# Patient Record
Sex: Male | Born: 1955 | Race: White | Hispanic: No | Marital: Married | Smoking: Former smoker
Health system: Southern US, Community
[De-identification: ages and names within clinical notes are randomized; demographics above are authoritative.]

## PROBLEM LIST (undated history)

## (undated) DIAGNOSIS — I10 Essential (primary) hypertension: Secondary | ICD-10-CM

## (undated) DIAGNOSIS — E785 Hyperlipidemia, unspecified: Secondary | ICD-10-CM

## (undated) DIAGNOSIS — L739 Follicular disorder, unspecified: Secondary | ICD-10-CM

## (undated) DIAGNOSIS — IMO0001 Reserved for inherently not codable concepts without codable children: Secondary | ICD-10-CM

## (undated) DIAGNOSIS — IMO0002 Reserved for concepts with insufficient information to code with codable children: Secondary | ICD-10-CM

## (undated) DIAGNOSIS — R000 Tachycardia, unspecified: Secondary | ICD-10-CM

## (undated) DIAGNOSIS — R030 Elevated blood-pressure reading, without diagnosis of hypertension: Secondary | ICD-10-CM

## (undated) HISTORY — PX: SKIN CANCER EXCISION: SHX779

## (undated) HISTORY — DX: Essential (primary) hypertension: I10

## (undated) HISTORY — PX: CORONARY ANGIOPLASTY WITH STENT PLACEMENT: SHX49

## (undated) HISTORY — DX: Elevated blood-pressure reading, without diagnosis of hypertension: R03.0

## (undated) HISTORY — DX: Hyperlipidemia, unspecified: E78.5

## (undated) HISTORY — DX: Reserved for inherently not codable concepts without codable children: IMO0001

## (undated) HISTORY — DX: Tachycardia, unspecified: R00.0

## (undated) HISTORY — DX: Reserved for concepts with insufficient information to code with codable children: IMO0002

## (undated) HISTORY — PX: SKIN CANCER DESTRUCTION: SHX778

## (undated) HISTORY — DX: Follicular disorder, unspecified: L73.9

---

## 2012-04-09 ENCOUNTER — Ambulatory Visit (INDEPENDENT_AMBULATORY_CARE_PROVIDER_SITE_OTHER): Payer: BC Managed Care – PPO | Admitting: Family Medicine

## 2012-04-09 ENCOUNTER — Encounter: Payer: Self-pay | Admitting: Family Medicine

## 2012-04-09 VITALS — BP 147/100 | HR 105 | Ht 68.0 in | Wt 240.0 lb

## 2012-04-09 DIAGNOSIS — R030 Elevated blood-pressure reading, without diagnosis of hypertension: Secondary | ICD-10-CM

## 2012-04-09 DIAGNOSIS — R000 Tachycardia, unspecified: Secondary | ICD-10-CM

## 2012-04-09 NOTE — Patient Instructions (Addendum)
Hypertension As your heart beats, it forces blood through your arteries. This force is your blood pressure. If the pressure is too high, it is called hypertension (HTN) or high blood pressure. HTN is dangerous because you may have it and not know it. High blood pressure may mean that your heart has to work harder to pump blood. Your arteries may be narrow or stiff. The extra work puts you at risk for heart disease, stroke, and other problems.  Blood pressure consists of two numbers, a higher number over a lower, 110/72, for example. It is stated as "110 over 72." The ideal is below 120 for the top number (systolic) and under 80 for the bottom (diastolic). Write down your blood pressure today. You should pay close attention to your blood pressure if you have certain conditions such as:  Heart failure.   Prior heart attack.   Diabetes   Chronic kidney disease.   Prior stroke.   Multiple risk factors for heart disease.  To see if you have HTN, your blood pressure should be measured while you are seated with your arm held at the level of the heart. It should be measured at least twice. A one-time elevated blood pressure reading (especially in the Emergency Department) does not mean that you need treatment. There may be conditions in which the blood pressure is different between your right and left arms. It is important to see your caregiver soon for a recheck. Most people have essential hypertension which means that there is not a specific cause. This type of high blood pressure may be lowered by changing lifestyle factors such as:  Stress.   Smoking.   Lack of exercise.   Excessive weight.   Drug/tobacco/alcohol use.   Eating less salt.  Most people do not have symptoms from high blood pressure until it has caused damage to the body. Effective treatment can often prevent, delay or reduce that damage. TREATMENT  When a cause has been identified, treatment for high blood pressure is  directed at the cause. There are a large number of medications to treat HTN. These fall into several categories, and your caregiver will help you select the medicines that are best for you. Medications may have side effects. You should review side effects with your caregiver. If your blood pressure stays high after you have made lifestyle changes or started on medicines,   Your medication(s) may need to be changed.   Other problems may need to be addressed.   Be certain you understand your prescriptions, and know how and when to take your medicine.   Be sure to follow up with your caregiver within the time frame advised (usually within two weeks) to have your blood pressure rechecked and to review your medications.   If you are taking more than one medicine to lower your blood pressure, make sure you know how and at what times they should be taken. Taking two medicines at the same time can result in blood pressure that is too low.  SEEK IMMEDIATE MEDICAL CARE IF:  You develop a severe headache, blurred or changing vision, or confusion.   You have unusual weakness or numbness, or a faint feeling.   You have severe chest or abdominal pain, vomiting, or breathing problems.  MAKE SURE YOU:   Understand these instructions.   Will watch your condition.   Will get help right away if you are not doing well or get worse.  Document Released: 08/05/2005 Document Revised: 07/25/2011 Document Reviewed:   03/25/2008 Exit[employer] Patient Information 2012 [place], [place].How to Take Your Blood Pressure  These instructions are only for electronic home blood pressure machines. You will need:   An automatic or semi-automatic blood pressure machine.   Fresh batteries for the blood pressure machine.  HOW DO I USE THESE TOOLS TO CHECK MY BLOOD PRESSURE?   There are 2 numbers that make up your blood pressure. For example: 120/80.   The first number (120 in our example) is called the "systolic pressure." It  is a measure of the pressure in your blood vessels when your heart is pumping blood.   The second number (80 in our example) is called the "diastolic pressure." It is a measure of the pressure in your blood vessels when your heart is resting between beats.   Before you buy a home blood pressure machine, check the size of your arm so you can buy the right size cuff. Here is how to check the size of your arm:   Use a tape measure that shows both inches and centimeters.   Wrap the tape measure around the middle upper part of your arm. You may need someone to help you measure right.   Write down your arm measurement in both inches and centimeters.   To measure your blood pressure right, it is important to have the right size cuff.   If your arm is up to 13 inches (37 to 34 centimeters), get an adult cuff size.   If your arm is 13 to 17 inches (35 to 44 centimeters), get a large adult cuff size.   If your arm is 17 to 20 inches (45 to 52 centimeters), get an adult thigh cuff.   Try to rest or relax for at least 30 minutes before you check your blood pressure.   Do not smoke.   Do not have any drinks with caffeine, such as:   Pop.   Coffee.   Tea.   Check your blood pressure in a quiet room.   Sit down and stretch out your arm on a table. Keep your arm at about the level of your heart. Let your arm relax.  GETTING BLOOD PRESSURE READINGS  Make sure you remove any tight-fighting clothing from your arm. Wrap the cuff around your upper arm. Wrap it just above the bend, and above where you felt the pulse. You should be able to slip a finger between the cuff and your arm. If you cannot slip a finger in the cuff, it is too tight and should be removed and rewrapped.   Some units requires you to manually pump up the arm cuff.   Automatic units inflate the cuff when you press a button.   Cuff deflation is automatic in both models.   After the cuff is inflated, the unit measures your  blood pressure and pulse. The readings are displayed on a monitor. Hold still and breathe normally while the cuff is inflated.   Getting a reading takes less than a minute.   Some models store readings in a memory. Some provide a printout of readings.   Get readings at different times of the day. You should wait at least 5 minutes between readings. Take readings with you to your next doctor's visit.  Document Released: 07/18/2008 Document Revised: 07/25/2011 Document Reviewed: 07/18/2008 [facility] Patient Information 2012 [place], [place].blooBlood Pressure Record Sheet Your blood pressure on this visit to the Emergency Department or Clinic is elevated. This does not necessarily mean you have hypertension,  but it does mean that your blood pressure needs to be rechecked. Many times blood pressure increases because of illness, pain, anxiety, or other factors. We recommend that you get a series of blood pressure readings done over a period of about 5 days. It is best to get a reading in the morning and one in the evening. You should make sure you sit and relax for 1 to 5 minutes before the reading is taken. Write the readings down and make a follow-up appointment with your doctor to discuss the results. If there is not a free clinic or a drug store with blood pressure taking machines near you, you can purchase good blood pressure taking equipment from a drug store, often for less than the price of a physician's office call. Having one in the home also allows you the convenience of taking your blood pressure while you are home and in relaxed surroundings. Your blood pressure in the Emergency Department or Clinic on ________ was ____________________. BLOOD PRESSURE LOG Date: _______________________  a.m. _____________________   p.m. _____________________  Date: _______________________  a.m. _____________________   p.m. _____________________  Date: _______________________  a.m. _____________________    p.m. _____________________  Date: _______________________  a.m. _____________________   p.m. _____________________  Date: _______________________  a.m. _____________________   p.m. _____________________  Document Released: 05/04/2003 Document Revised: 07/25/2011 Document Reviewed: 08/05/2005 ExitCare Patient Information 2012 [place], [place].

## 2012-04-09 NOTE — Progress Notes (Signed)
  Subjective:    Patient ID: Craig Mccormick, male    DOB: 1956-08-18, 56 y.o.   MRN: [id number] #1 Hypertension This is a new problem. The current episode started today. The problem is unchanged. The problem is uncontrolled. Pertinent negatives include no anxiety, blurred vision, chest pain, headaches, malaise/fatigue, neck pain, orthopnea, palpitations, peripheral edema, PND, shortness of breath or sweats. Agents associated with hypertension include decongestants, NSAIDs, amphetamines and anorectics. Risk factors for coronary artery disease include male gender, obesity, smoking/tobacco exposure and family history. Past treatments include nothing. There are no compliance problems.  There is no history of angina, kidney disease, CAD/MI, CVA, heart failure, left ventricular hypertrophy, PVD, renovascular disease, retinopathy or a thyroid problem. There is no history of chronic renal disease, coarctation of the aorta, hyperaldosteronism, hypercortisolism, hyperparathyroidism, a hypertension causing med, pheochromocytoma or sleep apnea.   #2 tachycardia he was in the [facility] this past weekend because of tachycardia. This is a second time he had a significant tachycardia he was given medication he states his cardiac enzymes were normal and he was released. He was also given diagnosis of dehydration as well.  #3 history of some sleep disturbance treatment with melatonin.   #4 tobacco abuse. He is using smokeless cigarettes i.e. electronic cigarettes to reduce his smoking exposure to the tar another agents.  Review of Systems  Constitutional: Negative for malaise/fatigue.  HENT: Negative for neck pain.   Eyes: Negative for blurred vision.  Respiratory: Negative for shortness of breath.   Cardiovascular: Negative for chest pain, palpitations, orthopnea and PND.  Neurological: Negative for headaches.      BP 147/100  Pulse 105  Ht 5\' 8"  (1.727 m)  Wt 240 lb (108.863 kg)  BMI 36.49 kg/m2   SpO2 94% Objective:   Physical Exam  Vitals reviewed. Constitutional: He appears well-developed and well-nourished.  HENT:  Head: Normocephalic and atraumatic.  Right Ear: External ear normal.  Left Ear: External ear normal.  Neck: Normal range of motion. Neck supple.  Cardiovascular: Normal rate, regular rhythm and normal heart sounds.   Pulmonary/Chest: Effort normal and breath sounds normal.  Musculoskeletal: Normal range of motion.  Neurological: He is alert. He has normal reflexes.  Skin: Skin is warm.       Multiple scars on face from previous skin biopsies  Psychiatric: He has a normal mood and affect. His behavior is normal.      Assessment & Plan:  #1 elevated blood pressure/#2 HX of  Tachycardia. Patient has had 2 episodes that he can remember of tachycardia no known history of elevation of his blood pressure but Sunday when he was in the ED he does admit that this his blood pressure may have been artificially low because of the rapid rate and the dehydration he was experiencing I recommended that he takes his blood pressure response a week lot those results in force and return in 2 months for yearly examination at that time we'll make a decision needs to go on blood pressure medicine and  #3 sleep disturbance. He takes melatonin for that or just take it for a few days in use is good for a while before the 6 to comes back. Since it works out but continued to maintain him on the melatonin.  #4 tobacco abuse discussed the need to continue wean himself off of all cigarettes.

## 2012-06-11 ENCOUNTER — Encounter: Payer: Self-pay | Admitting: Family Medicine

## 2012-06-11 ENCOUNTER — Ambulatory Visit (INDEPENDENT_AMBULATORY_CARE_PROVIDER_SITE_OTHER): Payer: BC Managed Care – PPO | Admitting: Family Medicine

## 2012-06-11 VITALS — BP 153/102 | HR 99 | Ht 68.0 in | Wt 240.0 lb

## 2012-06-11 DIAGNOSIS — R9431 Abnormal electrocardiogram [ECG] [EKG]: Secondary | ICD-10-CM

## 2012-06-11 DIAGNOSIS — K625 Hemorrhage of anus and rectum: Secondary | ICD-10-CM

## 2012-06-11 DIAGNOSIS — Z0000 Encounter for general adult medical examination without abnormal findings: Secondary | ICD-10-CM

## 2012-06-11 DIAGNOSIS — Z716 Tobacco abuse counseling: Secondary | ICD-10-CM

## 2012-06-11 DIAGNOSIS — I10 Essential (primary) hypertension: Secondary | ICD-10-CM

## 2012-06-11 LAB — POCT URINALYSIS DIPSTICK
Blood, UA: NEGATIVE
Protein, UA: NEGATIVE
Spec Grav, UA: >=1.03
Urobilinogen, UA: 0.2

## 2012-06-11 MED ORDER — LISINOPRIL-HYDROCHLOROTHIAZIDE 10-12.5 MG PO TABS: 1.0000 | ORAL_TABLET | Freq: Every day | ORAL | Status: DC

## 2012-06-11 NOTE — Patient Instructions (Signed)
Hypertension As your heart beats, it forces blood through your arteries. This force is your blood pressure. If the pressure is too high, it is called hypertension (HTN) or high blood pressure. HTN is dangerous because you may have it and not know it. High blood pressure may mean that your heart has to work harder to pump blood. Your arteries may be narrow or stiff. The extra work puts you at risk for heart disease, stroke, and other problems.  Blood pressure consists of two numbers, a higher number over a lower, 110/72, for example. It is stated as "110 over 72." The ideal is below 120 for the top number (systolic) and under 80 for the bottom (diastolic). Write down your blood pressure today. You should pay close attention to your blood pressure if you have certain conditions such as:  Heart failure.  Prior heart attack.  Diabetes  Chronic kidney disease.  Prior stroke.  Multiple risk factors for heart disease. To see if you have HTN, your blood pressure should be measured while you are seated with your arm held at the level of the heart. It should be measured at least twice. A one-time elevated blood pressure reading (especially in the Emergency Department) does not mean that you need treatment. There may be conditions in which the blood pressure is different between your right and left arms. It is important to see your caregiver soon for a recheck. Most people have essential hypertension which means that there is not a specific cause. This type of high blood pressure may be lowered by changing lifestyle factors such as:  Stress.  Smoking.  Lack of exercise.  Excessive weight.  Drug/tobacco/alcohol use.  Eating less salt. Most people do not have symptoms from high blood pressure until it has caused damage to the body. Effective treatment can often prevent, delay or reduce that damage. TREATMENT  When a cause has been identified, treatment for high blood pressure is directed at the  cause. There are a large number of medications to treat HTN. These fall into several categories, and your caregiver will help you select the medicines that are best for you. Medications may have side effects. You should review side effects with your caregiver. If your blood pressure stays high after you have made lifestyle changes or started on medicines,   Your medication(s) may need to be changed.  Other problems may need to be addressed.  Be certain you understand your prescriptions, and know how and when to take your medicine.  Be sure to follow up with your caregiver within the time frame advised (usually within two weeks) to have your blood pressure rechecked and to review your medications.  If you are taking more than one medicine to lower your blood pressure, make sure you know how and at what times they should be taken. Taking two medicines at the same time can result in blood pressure that is too low. SEEK IMMEDIATE MEDICAL CARE IF:  You develop a severe headache, blurred or changing vision, or confusion.  You have unusual weakness or numbness, or a faint feeling.  You have severe chest or abdominal pain, vomiting, or breathing problems. MAKE SURE YOU:   Understand these instructions.  Will watch your condition.  Will get help right away if you are not doing well or get worse. Document Released: 08/05/2005 Document Revised: 10/28/2011 Document Reviewed: 03/25/2008 [facility] Patient Information 2013 [place], [place]. Colonoscopy A colonoscopy is an exam to evaluate your entire colon. In this exam, your colon  is cleansed. A long fiberoptic tube is inserted through your rectum and into your colon. The fiberoptic scope (endoscope) is a long bundle of enclosed and very flexible fibers. These fibers transmit light to the area examined and send images from that area to your caregiver. Discomfort is usually minimal. You may be given a drug to help you sleep (sedative) during or prior to  the procedure. This exam helps to detect lumps (tumors), polyps, inflammation, and areas of bleeding. Your caregiver may also take a small piece of tissue (biopsy) that will be examined under a microscope. LET YOUR CAREGIVER KNOW ABOUT:   Allergies to food or medicine.  Medicines taken, including vitamins, herbs, eyedrops, over-the-counter medicines, and creams.  Use of steroids (by mouth or creams).  Previous problems with anesthetics or numbing medicines.  History of bleeding problems or blood clots.  Previous surgery.  Other health problems, including diabetes and kidney problems.  Possibility of pregnancy, if this applies. BEFORE THE PROCEDURE   A clear liquid diet may be required for 2 days before the exam.  Ask your caregiver about changing or stopping your regular medications.  Liquid injections (enemas) or laxatives may be required.  A large amount of electrolyte solution may be given to you to drink over a short period of time. This solution is used to clean out your colon.  You should be present 60 minutes prior to your procedure or as directed by your caregiver. AFTER THE PROCEDURE   If you received a sedative or pain relieving medication, you will need to arrange for someone to drive you home.  Occasionally, there is a little blood passed with the first bowel movement. Do not be concerned. FINDING OUT THE RESULTS OF YOUR TEST Not all test results are available during your visit. If your test results are not back during the visit, make an appointment with your caregiver to find out the results. Do not assume everything is normal if you have not heard from your caregiver or the medical facility. It is important for you to follow up on all of your test results. HOME CARE INSTRUCTIONS   It is not unusual to pass moderate amounts of gas and experience mild abdominal cramping following the procedure. This is due to air being used to inflate your colon during the exam.  Walking or a warm pack on your belly (abdomen) may help.  You may resume all normal meals and activities after sedatives and medicines have worn off.  Only take over-the-counter or prescription medicines for pain, discomfort, or fever as directed by your caregiver. Do not use aspirin or blood thinners if a biopsy was taken. Consult your caregiver for medicine usage if biopsies were taken. SEEK IMMEDIATE MEDICAL CARE IF:   You have a fever.  You pass large blood clots or fill a toilet with blood following the procedure. This may also occur 10 to 14 days following the procedure. This is more likely if a biopsy was taken.  You develop abdominal pain that keeps getting worse and cannot be relieved with medicine. Document Released: 08/02/2000 Document Revised: 10/28/2011 Document Reviewed: 03/17/2008 [facility] Patient Information 2013 [place], [place].

## 2012-06-11 NOTE — Progress Notes (Signed)
Subjective:    Patient ID: Craig Mccormick, male    DOB: Oct 08, 1955, 56 y.o.   MRN: [id number]  HPI  Patient is here for health maintaince.  Review of Systems  All other systems reviewed and are negative.   No Known Allergies History   Social History  . Marital Status: Married    Spouse Name: N/A    Number of Children: N/A  . Years of Education: N/A   Occupational History  . Not on file.   Social History Main Topics  . Smoking status: Current Every Day Smoker -- 0.5 packs/day    Types: Cigarettes  . Smokeless tobacco: Never Used  . Alcohol Use: 2.9 oz/week    4 Glasses of wine, 1 Drinks containing 0.5 oz of alcohol per week  . Drug Use: No  . Sexually Active: Yes    Birth Control/ Protection: Surgical   Other Topics Concern  . Not on file   Social History Narrative  . No narrative on file   Family History  Problem Relation Age of Onset  . Cancer Mother   . Heart disease Father   . Prostate cancer Father   . Hyperlipidemia Brother    Past Medical History  Diagnosis Date  . Tachycardia   . Elevated BP   . Squamous carcinoma   . Folliculitis    Past Surgical History  Procedure Date  . Skin cancer destruction   . Skin cancer excision        BP 153/102  Pulse 99  Ht 5\' 8"  (1.727 m)  Wt 240 lb (108.863 kg)  BMI 36.49 kg/m2  SpO2 95% Objective:   Physical Exam  Vitals reviewed. Constitutional: He is oriented to person, place, and time. He appears well-developed and well-nourished.  HENT:  Head: Normocephalic.  Right Ear: External ear normal.  Left Ear: External ear normal.  Eyes: Pupils are equal, round, and reactive to light.  Neck: Normal range of motion. Neck supple. No thyromegaly present.  Cardiovascular: Normal rate, regular rhythm and normal heart sounds.   Pulmonary/Chest: Effort normal and breath sounds normal. No respiratory distress. He has no wheezes.  Abdominal: Soft. Bowel sounds are normal. There is no tenderness. There is no  rebound. Hernia confirmed negative in the right inguinal area and confirmed negative in the left inguinal area.  Genitourinary: Prostate normal, testes normal and penis normal. Rectal exam shows no external hemorrhoid, no fissure, no mass, no tenderness and anal tone normal. Guaiac positive stool. Prostate is not enlarged and not tender. Right testis shows no mass and no tenderness. Left testis shows no mass and no tenderness. Circumcised. No phimosis, paraphimosis, hypospadias, penile erythema or penile tenderness. No discharge found.  Musculoskeletal: Normal range of motion. He exhibits no edema and no tenderness.  Lymphadenopathy:       Right: No inguinal adenopathy present.       Left: No inguinal adenopathy present.  Neurological: He is alert and oriented to person, place, and time. He has normal reflexes. No cranial nerve deficit.  Skin: Skin is warm and dry.  Psychiatric: He has a normal mood and affect. His behavior is normal.   / Results for orders placed in visit on 06/11/12  POCT URINALYSIS DIPSTICK      Component Value Range   Color, UA yellow     Clarity, UA clear     Glucose, UA neg     Bilirubin, UA neg     Ketones, UA neg     Spec  Grav, UA >=1.030     Blood, UA neg     pH, UA 5.5     Protein, UA neg     Urobilinogen, UA 0.2     Nitrite, UA neg     Leukocytes, UA Negative     EKG. in comparison to EKG he had 2 months ago which showed tachycardia with ischemic changes EKG was normal today.     Assessment & Plan:   #1 health maintenance. #2 tobacco abuse warned him to stop smoking #3 elevated blood pressure now diagnosed with hypertension. Lisinopril 10/12.5 one tablet a day followup in 4 months. #4 abnormal EKG with tachycardia resolved no ischemic changes. #5 rectal bleeding. Hemoccult was positive. He's had trouble with hemorrhoids before he also is due by age for colonoscopy we'll get him a GI referral for colonoscopy.

## 2012-06-12 ENCOUNTER — Encounter: Payer: Self-pay | Admitting: *Deleted

## 2012-06-12 LAB — LIPID PANEL
Cholesterol: 265 mg/dL — ABNORMAL HIGH (ref 0–200)
HDL: 40 mg/dL (ref >39)
LDL Cholesterol: 163 mg/dL — ABNORMAL HIGH (ref 0–99)
Triglycerides: 309 mg/dL — ABNORMAL HIGH (ref <150)

## 2012-06-12 LAB — COMPLETE METABOLIC PANEL WITH GFR
Alkaline Phosphatase: 65 U/L (ref 39–117)
BUN: 14 mg/dL (ref 6–23)
Creat: 0.94 mg/dL (ref 0.50–1.35)
GFR, Est Non African American: >89 mL/min
Glucose, Bld: 88 mg/dL (ref 70–99)
Total Bilirubin: 0.9 mg/dL (ref 0.3–1.2)

## 2012-06-12 LAB — PSA, TOTAL AND FREE: PSA, Free: 0.19 ng/mL

## 2012-06-16 ENCOUNTER — Other Ambulatory Visit: Payer: Self-pay | Admitting: Family Medicine

## 2012-06-16 ENCOUNTER — Encounter: Payer: Self-pay | Admitting: Family Medicine

## 2012-06-16 DIAGNOSIS — E785 Hyperlipidemia, unspecified: Secondary | ICD-10-CM

## 2012-06-16 MED ORDER — ATORVASTATIN CALCIUM 10 MG PO TABS: 10.0000 mg | ORAL_TABLET | Freq: Every day | ORAL | Status: DC

## 2012-06-16 NOTE — Progress Notes (Signed)
Pt informed

## 2012-10-12 ENCOUNTER — Ambulatory Visit: Payer: BC Managed Care – PPO | Admitting: Family Medicine

## 2012-10-12 DIAGNOSIS — Z0289 Encounter for other administrative examinations: Secondary | ICD-10-CM

## 2012-12-23 ENCOUNTER — Encounter: Payer: Self-pay | Admitting: Family Medicine

## 2012-12-23 DIAGNOSIS — I2510 Atherosclerotic heart disease of native coronary artery without angina pectoris: Secondary | ICD-10-CM | POA: Insufficient documentation

## 2012-12-23 DIAGNOSIS — Z9861 Coronary angioplasty status: Secondary | ICD-10-CM | POA: Insufficient documentation

## 2013-01-19 ENCOUNTER — Encounter: Payer: Self-pay | Admitting: Family Medicine

## 2013-01-19 ENCOUNTER — Other Ambulatory Visit: Payer: Self-pay | Admitting: Family Medicine

## 2013-01-19 DIAGNOSIS — I2510 Atherosclerotic heart disease of native coronary artery without angina pectoris: Secondary | ICD-10-CM

## 2013-06-04 ENCOUNTER — Ambulatory Visit (INDEPENDENT_AMBULATORY_CARE_PROVIDER_SITE_OTHER): Payer: BC Managed Care – PPO | Admitting: Family Medicine

## 2013-06-04 ENCOUNTER — Encounter: Payer: Self-pay | Admitting: Family Medicine

## 2013-06-04 VITALS — BP 117/85 | HR 93 | Temp 98.0°F | Wt 242.0 lb

## 2013-06-04 DIAGNOSIS — J209 Acute bronchitis, unspecified: Secondary | ICD-10-CM

## 2013-06-04 MED ORDER — DOXYCYCLINE HYCLATE 100 MG PO TABS: ORAL_TABLET | ORAL | Status: AC

## 2013-06-04 NOTE — Progress Notes (Signed)
CC: Craig Mccormick is a 57 y.o. male is here for chest congestion and Cough   Subjective: HPI:  Patient complains of cough that has been present since Monday of this week described as nonproductive mild to moderate in severity have been worsening all week however today feels 50% better. has been accompanied by facial pressure in the forehead along with nasal congestion. Has had chills on a nightly basis without objective fever. He denies shortness of breath or wheezing or chest pain. Symptoms are present all hours of the day worse in the morning slightly improved with Alka-Seltzer   Review Of Systems Outlined In HPI  Past Medical History  Diagnosis Date  . Tachycardia   . Elevated BP   . Squamous carcinoma   . Folliculitis      Family History  Problem Relation Age of Onset  . Cancer Mother   . Heart disease Father   . Prostate cancer Father   . Hyperlipidemia Brother      History  Substance Use Topics  . Smoking status: Current Every Day Smoker -- 0.50 packs/day    Types: Cigarettes  . Smokeless tobacco: Never Used  . Alcohol Use: 2.9 oz/week    4 Glasses of wine, 1 Drinks containing 0.5 oz of alcohol per week     Objective: Filed Vitals:   06/04/13 1610  BP: 117/85  Pulse: 93  Temp: 98 F (36.7 C)    General: Alert and Oriented, No Acute Distress HEENT: Pupils equal, round, reactive to light. Conjunctivae clear.  External ears unremarkable, canals clear with intact TMs with appropriate landmarks.  Middle ear appears open without effusion. Boggy erythematous inferior turbinates with moderate mucoid discharge.  Moist mucous membranes, pharynx without inflammation nor lesions.  Neck supple without palpable lymphadenopathy nor abnormal masses. Lungs: Clear to auscultation bilaterally, no wheezing/ronchi/rales.  Comfortable work of breathing. Good air movement. Cardiac: Regular rate and rhythm. Normal S1/S2.  No murmurs, rubs, nor gallops.   Extremities: No peripheral  edema.  Strong peripheral pulses.  Mental Status: No depression, anxiety, nor agitation. Skin: Warm and dry.  Assessment & Plan: Craig Mccormick was seen today for chest congestion and cough.  Diagnoses and associated orders for this visit:  Acute bronchitis - doxycycline (VIBRA-TABS) 100 MG tablet; One by mouth twice a day for ten days.    Reassurance provided that this is likely a resolving viral respiratory illness, should he have any rebound of his cough or facial pressure start doxycycline over the weekend otherwise continue Alka-Seltzer cold and sinus would expect daily improvement with this alone.  Return if symptoms worsen or fail to improve.

## 2013-06-18 ENCOUNTER — Encounter: Payer: Self-pay | Admitting: Family Medicine

## 2013-06-18 ENCOUNTER — Ambulatory Visit (INDEPENDENT_AMBULATORY_CARE_PROVIDER_SITE_OTHER): Payer: BC Managed Care – PPO | Admitting: Family Medicine

## 2013-06-18 VITALS — BP 118/79 | HR 89 | Wt 241.0 lb

## 2013-06-18 DIAGNOSIS — E785 Hyperlipidemia, unspecified: Secondary | ICD-10-CM

## 2013-06-18 DIAGNOSIS — I10 Essential (primary) hypertension: Secondary | ICD-10-CM

## 2013-06-18 DIAGNOSIS — I2510 Atherosclerotic heart disease of native coronary artery without angina pectoris: Secondary | ICD-10-CM

## 2013-06-18 DIAGNOSIS — Z125 Encounter for screening for malignant neoplasm of prostate: Secondary | ICD-10-CM

## 2013-06-18 DIAGNOSIS — R1110 Vomiting, unspecified: Secondary | ICD-10-CM

## 2013-06-18 DIAGNOSIS — Z1211 Encounter for screening for malignant neoplasm of colon: Secondary | ICD-10-CM

## 2013-06-18 NOTE — Progress Notes (Signed)
CC: Craig Mccormick is a 57 y.o. male is here for discuss labs   Subjective: HPI:  Followup hyperlipidemia: Has been taking Lipitor and a daily basis it has been greater than one year since his cholesterol was checked by [place] he is unsure whether or not he had it checked at the time of his heart attack over the summer. Denies right upper quadrant pain myalgias nor skin or scleral discoloration. Carries a diagnosis of coronary artery disease with followup with cardiology in 6 months denies chest pain or shortness of breath since his discharge.  Complains that he was been vomiting one to 2 times a day ever since starting doxycycline it comes on suddenly is described as green bile small in volume and has not been accompanied by any other symptoms. He states he feels great up until the point where he vomits within seconds later is back to feeling great again. Denies fevers, chills, abdominal pain, right upper quadrant pain, nausea, confusion, decreased appetite, diarrhea or constipation  Followup hypertension: He is asking if he can cut back on some of his blood pressure medications no outside blood pressures to report.   Review Of Systems Outlined In HPI  Past Medical History  Diagnosis Date  . Tachycardia   . Elevated BP   . Squamous carcinoma   . Folliculitis      Family History  Problem Relation Age of Onset  . Cancer Mother   . Heart disease Father   . Prostate cancer Father   . Hyperlipidemia Brother      History  Substance Use Topics  . Smoking status: Current Every Day Smoker -- 0.50 packs/day    Types: Cigarettes  . Smokeless tobacco: Never Used  . Alcohol Use: 2.9 oz/week    4 Glasses of wine, 1 Drinks containing 0.5 oz of alcohol per week     Objective: Filed Vitals:   06/18/13 1604  BP: 118/79  Pulse: 89    General: Alert and Oriented, No Acute Distress HEENT: Pupils equal, round, reactive to light. Conjunctivae clear.  Moist mucous membranes pharynx  unremarkable Lungs: Clear to auscultation bilaterally, no wheezing/ronchi/rales.  Comfortable work of breathing. Good air movement. Cardiac: Regular rate and rhythm. Normal S1/S2.  No murmurs, rubs, nor gallops.   Abdomen: Normal bowel sounds, soft and non tender without palpable masses. No rebound or guarding Extremities: No peripheral edema.  Strong peripheral pulses.  Mental Status: No depression, anxiety, nor agitation. Skin: Warm and dry.  Assessment & Plan: Craig Mccormick was seen today for discuss labs.  Diagnoses and associated orders for this visit:  CAD (coronary artery disease)  Hyperlipidemia - Lipid Profile  Vomiting - COMPLETE METABOLIC PANEL WITH GFR  Screening for prostate cancer - PSA  Screening for colon cancer - Ambulatory referral to Gastroenterology  Hypertension    Coronary artery disease: Stable continue asprin and effient. Hyperlipidemia: Due for recheck with goal LDL less than 70, checking liver enzymes Vomiting: Suspect this is due to doxycycline which he has finished his regimen, if symptoms return 48 hours after the last dose will pursue a more aggressive workup however start with metabolic panel Hypertension: He takes his blood pressure at home therefore he can take half a tablet of lisinopril-hydrochlorothiazide if blood pressure remains below 140/90 for week consider stopping completely un less blood pressure returns above 140/90 point. Under no circumstances should he stop the Toprol unless told to by his cardiologist He is overdue for PSA and colonoscopy  Return in about 3 months (  around 09/18/2013).

## 2013-06-19 LAB — COMPLETE METABOLIC PANEL WITH GFR
Albumin: 4.2 g/dL (ref 3.5–5.2)
BUN: 16 mg/dL (ref 6–23)
CO2: 27 mEq/L (ref 19–32)
Calcium: 9.1 mg/dL (ref 8.4–10.5)
Chloride: 103 mEq/L (ref 96–112)
GFR, Est African American: >89 mL/min
GFR, Est Non African American: >89 mL/min
Glucose, Bld: 88 mg/dL (ref 70–99)
Potassium: 4 mEq/L (ref 3.5–5.3)
Sodium: 139 mEq/L (ref 135–145)
Total Protein: 6.7 g/dL (ref 6.0–8.3)

## 2013-06-19 LAB — PSA: PSA: 0.78 ng/mL (ref <=4.00)

## 2013-06-19 LAB — LIPID PANEL
Cholesterol: 168 mg/dL (ref 0–200)
VLDL: 41 mg/dL — ABNORMAL HIGH (ref 0–40)

## 2013-06-21 ENCOUNTER — Telehealth: Payer: Self-pay | Admitting: Family Medicine

## 2013-06-21 DIAGNOSIS — I2510 Atherosclerotic heart disease of native coronary artery without angina pectoris: Secondary | ICD-10-CM

## 2013-06-21 DIAGNOSIS — E785 Hyperlipidemia, unspecified: Secondary | ICD-10-CM

## 2013-06-21 MED ORDER — ATORVASTATIN CALCIUM 80 MG PO TABS: 80.0000 mg | ORAL_TABLET | Freq: Every day | ORAL | Status: DC

## 2013-06-21 NOTE — Telephone Encounter (Signed)
Sue Lush, Will you please let Mr. Riviello know that his LDL cholesterol was just a few points above his goal of less than 70.  I would encourage him to increase his lipitor to 80mg  daily, I've sent an updated Rx to his [facility] on [address].  Fasting blood sugar, kidney function, and liver function were perfect.  His PSA was normal suggesting low risk of prostate cancer.  Return in 3 months for cholesterol recheck.

## 2013-06-21 NOTE — Telephone Encounter (Signed)
Pt.notified

## 2013-07-13 ENCOUNTER — Encounter: Payer: Self-pay | Admitting: Physician Assistant

## 2013-07-28 ENCOUNTER — Other Ambulatory Visit: Payer: Self-pay | Admitting: *Deleted

## 2013-07-28 ENCOUNTER — Telehealth: Payer: Self-pay | Admitting: *Deleted

## 2013-07-28 NOTE — Telephone Encounter (Signed)
The patient called and said his cardiologist is Dr. Donnetta Simpers from [facility].  In June he had angioplasty with stent placement.  I told him I will call them for records. I told the patient if they will not send me the records without a release signed, I will have him sign it when he comes here for his appointment.

## 2013-07-28 NOTE — Telephone Encounter (Signed)
LM for patient asking if he had a cardiologist. Asked him to please call me back before his appointment on Friday.

## 2013-07-30 ENCOUNTER — Encounter: Payer: Self-pay | Admitting: Physician Assistant

## 2013-07-30 ENCOUNTER — Ambulatory Visit (INDEPENDENT_AMBULATORY_CARE_PROVIDER_SITE_OTHER): Payer: BC Managed Care – PPO | Admitting: Physician Assistant

## 2013-07-30 VITALS — BP 120/82 | HR 76 | Ht 68.0 in | Wt 244.6 lb

## 2013-07-30 DIAGNOSIS — Z1211 Encounter for screening for malignant neoplasm of colon: Secondary | ICD-10-CM

## 2013-07-30 DIAGNOSIS — Z7901 Long term (current) use of anticoagulants: Secondary | ICD-10-CM

## 2013-07-30 NOTE — Progress Notes (Signed)
Reviewed and agree with management. Robert D. Kaplan, M.D., FACG  

## 2013-07-30 NOTE — Patient Instructions (Signed)
We will send you a letter in August 2015 to call and make an appointment to see Mike Gip PA-C to discuss a colonoscopy.

## 2013-07-30 NOTE — Progress Notes (Signed)
Subjective:    Patient ID: Craig Mccormick, male    DOB: August 29, 1955, 57 y.o.   MRN: [id number]  HPI  Craig Mccormick is a pleasant 57 year old white male new to GI today, referred by his primary care physician for colon neoplasia screening. Patient has no current GI symptoms. Specifically no complaints of abdominal pain alteration in bowel habits melena or hematochezia. His family history is negative for colon cancer polyps as far as he is aware. Patient states that he developed some PSVT earlier in the year and then some exertional chest pressure which led to cardiac evaluation and cardiac catheter. He was found to have an LAD stenosis and states that he underwent stent placement with Dr. Donnetta Simpers  at [facility] cardiology. He has been on Effient  Since. He says he feels well and has had no further symptoms. He was told that he would be on the Effient  at least a year.     Review of Systems  Constitutional: Negative.   HENT: Negative.   Eyes: Negative.   Respiratory: Negative.   Cardiovascular: Negative.   Gastrointestinal: Negative.   Endocrine: Negative.   Genitourinary: Negative.   Musculoskeletal: Negative.   Skin: Negative.   Allergic/Immunologic: Negative.   Neurological: Negative.   Hematological: Negative.   Psychiatric/Behavioral: Negative.    Outpatient Prescriptions Prior to Visit  Medication Sig Dispense Refill  . aspirin 81 MG tablet Take 81 mg by mouth daily.      Marland Kitchen atorvastatin (LIPITOR) 80 MG tablet Take 1 tablet (80 mg total) by mouth daily.  30 tablet  11  . fish oil-omega-3 fatty acids 1000 MG capsule Take 2 g by mouth daily.      . metoprolol (LOPRESSOR) 50 MG tablet Take 50 mg by mouth 2 (two) times daily.   30 tablet  11  . prasugrel (EFFIENT) 10 MG TABS Take 1 tablet (10 mg total) by mouth daily.  30 tablet  11  . co-enzyme Q-10 30 MG capsule Take 30 mg by mouth 3 (three) times daily.      Marland Kitchen lisinopril-hydrochlorothiazide (ZESTORETIC) 10-12.5 MG per tablet Take 1  tablet by mouth daily.  1 tablet  0   No facility-administered medications prior to visit.   No Known Allergies Patient Active Problem List   Diagnosis Date Noted  . Hyperlipidemia 06/18/2013  . CAD (coronary artery disease) 12/23/2012  . S/P PTCA (percutaneous transluminal coronary angioplasty) 12/23/2012  . Hypertension 06/11/2012   History  Substance Use Topics  . Smoking status: Current Every Day Smoker -- 0.50 packs/day    Types: Cigarettes  . Smokeless tobacco: Never Used     Comment: pt attempting to d/c; form given 07-30-13  . Alcohol Use: 0.0 oz/week     Comment: 1 drink daily   family history includes Diabetes in his father; Heart disease in his father; Hyperlipidemia in his brother; Lung cancer in his mother; Prostate cancer in his father. There is no history of Colon cancer, Throat cancer, Kidney disease, or Liver disease.      Objective:   Physical Exam well-developed white male in no acute distress, pleasant blood pressure 120/82 pulse 76 height 5 foot 8 weight 244. HEENT;nontraumatic normocephalic EOMI PERRLA sclera anicteric, Supple no JVD, Cardiovascular ;regular rate and rhythm with S1-S2 no murmur or gallop, Pulmonary; clear bilaterally, Abdomen; soft nontender nondistended bowel sounds are active there is no palpable mass or hepatosplenomegaly, Rectal ;exam not done, Extremities; no clubbing cyanosis or edema skin warm and dry, Psych; mood and  affect appropriate        Assessment& Plan:  #11  57 year old white male referred for colon neoplasia screening, average risk- asymptomatic #2 coronary artery disease status post PTCA and stent LAD June 2014 #3 chronic anticoagulation-on Effient   Plan; Discussion with the patient regarding screening colonoscopy  Patient would need to stop his anticoagulation and therefore do not feel he is an appropriate candidate for colonoscopy just yet. Would like him to complete one year of anticoagulation and discuss with his  cardiologist. We will make him a followup reminder to come back and schedule colonoscopy late summer 2015 once he is more safely able to come off of anticoagulation.

## 2013-08-27 ENCOUNTER — Encounter: Payer: Self-pay | Admitting: Family Medicine

## 2013-08-27 DIAGNOSIS — Z0000 Encounter for general adult medical examination without abnormal findings: Secondary | ICD-10-CM | POA: Insufficient documentation

## 2013-10-26 ENCOUNTER — Ambulatory Visit (INDEPENDENT_AMBULATORY_CARE_PROVIDER_SITE_OTHER): Payer: BC Managed Care – PPO | Admitting: Family Medicine

## 2013-10-26 ENCOUNTER — Encounter: Payer: Self-pay | Admitting: Family Medicine

## 2013-10-26 VITALS — BP 124/86 | HR 77 | Temp 97.5°F | Wt 241.0 lb

## 2013-10-26 DIAGNOSIS — R000 Tachycardia, unspecified: Secondary | ICD-10-CM

## 2013-10-26 DIAGNOSIS — B9689 Other specified bacterial agents as the cause of diseases classified elsewhere: Secondary | ICD-10-CM

## 2013-10-26 DIAGNOSIS — J329 Chronic sinusitis, unspecified: Secondary | ICD-10-CM

## 2013-10-26 DIAGNOSIS — A499 Bacterial infection, unspecified: Secondary | ICD-10-CM

## 2013-10-26 MED ORDER — AMOXICILLIN-POT CLAVULANATE 500-125 MG PO TABS: ORAL_TABLET | ORAL | Status: AC

## 2013-10-26 NOTE — Progress Notes (Signed)
CC: Craig Mccormick is a 58 y.o. male is here for head and chest congestion   Subjective: HPI:  Complains of nasal congestion and facial pressure localizes beneath both eyes that has been present for the past 1-1/2 weeks. Was improving until this last Saturday for his significant decline occurred in the above symptoms now moderate severity present all hours of the day. Slightly improved with TheraFlu nothing else makes better or worse. Accompanied by subjective fevers for the past 2 days. Denies cough, shortness of breath, wheezing, chest pain, motor or sensory disturbances nor rashes.  He had an episode of tachycardia that occurred over the weekend he had been leaning over for a little under a minute and when suddenly standing up he estimates his pulse got as high as the 160. After sitting down for a minute his pulse slowly subsided he denies any chest pain during this episode. He did take a nitroglycerin tablet and had almost sudden return of his pulse to somewhere around the 90s or below. He denies any episodes like this recently or remotely other than this one episode. Since the episode occurred he can climb a flight of stairs without any shortness of breath irregular heartbeat or chest pain.   Review Of Systems Outlined In HPI  Past Medical History  Diagnosis Date  . Tachycardia   . Elevated BP   . Squamous carcinoma   . Folliculitis   . Hypertension   . Hyperlipemia     Past Surgical History  Procedure Laterality Date  . Skin cancer destruction    . Skin cancer excision    . Coronary angioplasty with stent placement     Family History  Problem Relation Age of Onset  . Lung cancer Mother   . Heart disease Father   . Prostate cancer Father   . Hyperlipidemia Brother   . Colon cancer Neg Hx   . Throat cancer Neg Hx   . Diabetes Father   . Kidney disease Neg Hx   . Liver disease Neg Hx     History   Social History  . Marital Status: Married    Spouse Name: N/A    Number  of Children: N/A  . Years of Education: N/A   Occupational History  . Not on file.   Social History Main Topics  . Smoking status: Current Every Day Smoker -- 0.50 packs/day    Types: Cigarettes  . Smokeless tobacco: Never Used     Comment: pt attempting to d/c; form given 07-30-13  . Alcohol Use: 0.0 oz/week     Comment: 1 drink daily  . Drug Use: No  . Sexual Activity: Yes    Birth Control/ Protection: Surgical   Other Topics Concern  . Not on file   Social History Narrative  . No narrative on file     Objective: BP 124/86  Pulse 77  Temp(Src) 97.5 F (36.4 C) (Oral)  Wt 241 lb (109.317 kg)  SpO2 96%  General: Alert and Oriented, No Acute Distress HEENT: Pupils equal, round, reactive to light. Conjunctivae clear.  External ears unremarkable, canals clear with intact TMs with appropriate landmarks.  Middle ear appears open without effusion. Pink inferior turbinates.  Moist mucous membranes, pharynx without inflammation nor lesions however moderate cobblestoning and postnasal drip.  Neck supple without palpable lymphadenopathy nor abnormal masses. Lungs: Clear to auscultation bilaterally, no wheezing/ronchi/rales.  Comfortable work of breathing. Good air movement. Cardiac: Regular rate and rhythm. Normal S1/S2.  No murmurs, rubs, nor gallops.  No carotid bruits Mental Status: No depression, anxiety, nor agitation. Skin: Warm and dry.  Assessment & Plan: Craig Mccormick was seen today for head and chest congestion.  Diagnoses and associated orders for this visit:  Bacterial sinusitis - amoxicillin-clavulanate (AUGMENTIN) 500-125 MG per tablet; Take one by mouth every 8 hours for ten total days.  Tachycardia    Bacterial sinusitis: Start Augmentin consider nasal saline washes avoid decongestants Tachycardia: Suspect that his brief episode of tachycardia was due to a change in position causing transient hypotension with resultant tachycardia.  This is likely further  complicated by using decongestants for the above sinusitis. If this event occurs despite avoiding decongestants followup with cardiology urgently  Return if symptoms worsen or fail to improve.

## 2013-11-15 ENCOUNTER — Encounter: Payer: Self-pay | Admitting: Emergency Medicine

## 2013-11-15 ENCOUNTER — Emergency Department (INDEPENDENT_AMBULATORY_CARE_PROVIDER_SITE_OTHER): Payer: BC Managed Care – PPO

## 2013-11-15 ENCOUNTER — Emergency Department
Admission: EM | Admit: 2013-11-15 | Discharge: 2013-11-15 | Disposition: A | Discharge status: Home / Self Care | Payer: BC Managed Care – PPO | Source: Home / Self Care | Attending: Family Medicine | Admitting: Family Medicine

## 2013-11-15 DIAGNOSIS — S8263XA Displaced fracture of lateral malleolus of unspecified fibula, initial encounter for closed fracture: Secondary | ICD-10-CM

## 2013-11-15 DIAGNOSIS — X58XXXA Exposure to other specified factors, initial encounter: Secondary | ICD-10-CM

## 2013-11-15 DIAGNOSIS — S93401A Sprain of unspecified ligament of right ankle, initial encounter: Secondary | ICD-10-CM

## 2013-11-15 DIAGNOSIS — S93409A Sprain of unspecified ligament of unspecified ankle, initial encounter: Secondary | ICD-10-CM

## 2013-11-15 DIAGNOSIS — S8261XA Displaced fracture of lateral malleolus of right fibula, initial encounter for closed fracture: Secondary | ICD-10-CM

## 2013-11-15 NOTE — ED Notes (Signed)
Craig Mccormick reports rolling his right ankle yesterday hearing a "pop". Swelling present. Previous fracture.

## 2013-11-15 NOTE — Discharge Instructions (Signed)
Apply ice pack for 30 minutes every 2 to 4 hours until swelling decreases.  Elevate. Wear cam walker.  Use crutches until cleared by sports medicine physician.  May take Tylenol for pain.  Ensure adequate Vitamin D and calcium intake.   Ankle Sprain An ankle sprain is an injury to the strong, fibrous tissues (ligaments) that hold the bones of your ankle joint together.  CAUSES An ankle sprain is usually caused by a fall or by twisting your ankle. Ankle sprains most commonly occur when you step on the outer edge of your foot, and your ankle turns inward. People who participate in sports are more prone to these types of injuries.  SYMPTOMS   Pain in your ankle. The pain may be present at rest or only when you are trying to stand or walk.  Swelling.  Bruising. Bruising may develop immediately or within 1 to 2 days after your injury.  Difficulty standing or walking, particularly when turning corners or changing directions. DIAGNOSIS  Your caregiver will ask you details about your injury and perform a physical exam of your ankle to determine if you have an ankle sprain. During the physical exam, your caregiver will press on and apply pressure to specific areas of your foot and ankle. Your caregiver will try to move your ankle in certain ways. An X-ray exam may be done to be sure a bone was not broken or a ligament did not separate from one of the bones in your ankle (avulsion fracture).  TREATMENT  Certain types of braces can help stabilize your ankle. Your caregiver can make a recommendation for this. Your caregiver may recommend the use of medicine for pain. If your sprain is severe, your caregiver may refer you to a surgeon who helps to restore function to parts of your skeletal system (orthopedist) or a physical therapist. HOME CARE INSTRUCTIONS   Apply ice to your injury for 1 2 days or as directed by your caregiver. Applying ice helps to reduce inflammation and pain.  Put ice in a plastic  bag.  Place a towel between your skin and the bag.  Leave the ice on for 15-20 minutes at a time, every 2 hours while you are awake.  Only take over-the-counter or prescription medicines for pain, discomfort, or fever as directed by your caregiver.  Elevate your injured ankle above the level of your heart as much as possible for 2 3 days.  If your caregiver recommends crutches, use them as instructed. Gradually put weight on the affected ankle. Continue to use crutches or a cane until you can walk without feeling pain in your ankle.  If you have a plaster splint, wear the splint as directed by your caregiver. Do not rest it on anything harder than a pillow for the first 24 hours. Do not put weight on it. Do not get it wet. You may take it off to take a shower or bath.  You may have been given an elastic bandage to wear around your ankle to provide support. If the elastic bandage is too tight (you have numbness or tingling in your foot or your foot becomes cold and blue), adjust the bandage to make it comfortable.  If you have an air splint, you may blow more air into it or let air out to make it more comfortable. You may take your splint off at night and before taking a shower or bath. Wiggle your toes in the splint several times per day to decrease swelling.  SEEK MEDICAL CARE IF:   You have rapidly increasing bruising or swelling.  Your toes feel extremely cold or you lose feeling in your foot.  Your pain is not relieved with medicine. SEEK IMMEDIATE MEDICAL CARE IF:  Your toes are numb or blue.  You have severe pain that is increasing. MAKE SURE YOU:   Understand these instructions.  Will watch your condition.  Will get help right away if you are not doing well or get worse. Document Released: 08/05/2005 Document Revised: 04/29/2012 Document Reviewed: 08/17/2011 [facility] Patient Information 2014 [employer], [place].

## 2013-11-15 NOTE — ED Provider Notes (Signed)
CSN: [id number]     Arrival date & time 11/15/13  1637 History   First MD Initiated Contact with Patient 11/15/13 1734     Chief Complaint  Patient presents with  . Ankle Pain      HPI Comments: Patient inverted his right ankle yesterday and heard/felt a "popping" sensation in his ankle.  He has had persistent pain and swelling.  Patient is a 58 y.o. male presenting with ankle pain. The history is provided by the patient.  Ankle Pain Location:  Ankle Time since incident:  1 day Injury: yes   Mechanism of injury comment:  "rolled" ankle Ankle location:  R ankle Pain details:    Quality:  Aching   Radiates to:  Does not radiate   Severity:  Mild   Onset quality:  Sudden   Duration:  1 day   Timing:  Constant   Progression:  Unchanged Chronicity:  New Dislocation: no   Prior injury to area:  Yes Relieved by:  Nothing Worsened by:  Bearing weight Ineffective treatments:  None tried Associated symptoms: decreased ROM, stiffness and swelling   Associated symptoms: no back pain, no muscle weakness, no numbness and no tingling   Risk factors: obesity     Past Medical History  Diagnosis Date  . Tachycardia   . Elevated BP   . Squamous carcinoma   . Folliculitis   . Hypertension   . Hyperlipemia    Past Surgical History  Procedure Laterality Date  . Skin cancer destruction    . Skin cancer excision    . Coronary angioplasty with stent placement     Family History  Problem Relation Age of Onset  . Lung cancer Mother   . Heart disease Father   . Prostate cancer Father   . Hyperlipidemia Brother   . Colon cancer Neg Hx   . Throat cancer Neg Hx   . Diabetes Father   . Kidney disease Neg Hx   . Liver disease Neg Hx    History  Substance Use Topics  . Smoking status: Current Every Day Smoker -- 0.50 packs/day    Types: Cigarettes  . Smokeless tobacco: Never Used     Comment: pt attempting to d/c; form given 07-30-13  . Alcohol Use: 0.0 oz/week     Comment: 1 drink  daily    Review of Systems  Musculoskeletal: Positive for stiffness. Negative for back pain.  All other systems reviewed and are negative.    Allergies  Doxycycline  Home Medications   Current Outpatient Rx  Name  Route  Sig  Dispense  Refill  . aspirin 81 MG tablet   Oral   Take 81 mg by mouth daily.         Marland Kitchen. atorvastatin (LIPITOR) 80 MG tablet   Oral   Take 1 tablet (80 mg total) by mouth daily.   30 tablet   11   . co-enzyme Q-10 30 MG capsule   Oral   Take 30 mg by mouth 3 (three) times daily.         . fish oil-omega-3 fatty acids 1000 MG capsule   Oral   Take 2 g by mouth daily.         . Melatonin 3 MG TABS   Oral   Take 5 tablets by mouth at bedtime as needed.          . metoprolol (LOPRESSOR) 50 MG tablet   Oral   Take 50 mg by mouth  2 (two) times daily.    30 tablet   11   . Multiple Vitamin (MULTIVITAMIN) tablet   Oral   Take 1 tablet by mouth daily.         . prasugrel (EFFIENT) 10 MG TABS   Oral   Take 1 tablet (10 mg total) by mouth daily.   30 tablet   11    BP 134/93  Pulse 83  Resp 14  Wt 243 lb (110.224 kg)  SpO2 97% Physical Exam  Nursing note and vitals reviewed. Constitutional: He is oriented to person, place, and time. He appears well-developed and well-nourished. No distress.  HENT:  Head: Atraumatic.  Eyes: Conjunctivae are normal. Pupils are equal, round, and reactive to light.  Musculoskeletal:       Right ankle: He exhibits swelling. He exhibits no ecchymosis, no deformity, no laceration and normal pulse. Tenderness. Lateral malleolus, AITFL, CF ligament, posterior TFL and proximal fibula tenderness found. No medial malleolus and no head of 5th metatarsal tenderness found.       Feet:  Neurological: He is alert and oriented to person, place, and time.  Skin: Skin is warm and dry.    ED Course  Procedures  none    Imaging Review Dg Ankle Complete Right  11/15/2013   CLINICAL DATA:  Pain post trauma   EXAM: RIGHT ANKLE - COMPLETE 3+ VIEW  COMPARISON:  None.  FINDINGS: Frontal, oblique, and lateral views were obtained. There is marked swelling laterally. There is a focal avulsion type fracture arising from the lateral malleolus in near anatomic alignment. No other fractures. Ankle mortise appears intact. There is a small joint effusion. There is a small spur arising from the inferior calcaneus.  IMPRESSION: Avulsion fracture arising from lateral malleolus with small joint effusion as well as marked swelling laterally. Ankle mortise appears intact.   Electronically Signed   By: Bretta Bang M.D.   On: 11/15/2013 17:34     MDM   1. Avulsion fracture of lateral malleolus of right fibula   2. Sprain of right ankle    Cam Walker applied. Apply ice pack for 30 minutes every 2 to 4 hours until swelling decreases.  Elevate. Wear cam walker.  Use crutches until cleared by sports medicine physician.  May take Tylenol for pain.  Ensure adequate Vitamin D and calcium intake. Followup with Dr. Rodney Langton ([facility]) in about 4 days.    Lattie Haw, MD 11/19/13 [phone]

## 2013-12-07 ENCOUNTER — Ambulatory Visit (INDEPENDENT_AMBULATORY_CARE_PROVIDER_SITE_OTHER): Payer: BC Managed Care – PPO | Admitting: Sports Medicine

## 2013-12-07 ENCOUNTER — Encounter: Payer: Self-pay | Admitting: Sports Medicine

## 2013-12-07 VITALS — BP 139/87 | HR 70 | Ht 68.0 in | Wt 246.0 lb

## 2013-12-07 DIAGNOSIS — S8263XA Displaced fracture of lateral malleolus of unspecified fibula, initial encounter for closed fracture: Secondary | ICD-10-CM

## 2013-12-07 DIAGNOSIS — S8261XA Displaced fracture of lateral malleolus of right fibula, initial encounter for closed fracture: Secondary | ICD-10-CM | POA: Insufficient documentation

## 2013-12-07 NOTE — Progress Notes (Signed)
Patient ID: Craig Mccormick, male   DOB: 1955/09/18, 58 y.o.   MRN: [id number]   Subjective:    I'm seeing this patient as a consultation for:  Dr. Cathren HarshBeese  CC: Fracture R. Ankle  HPI:  Craig Mccormick is a pleasant 58 yo man seen as a consult from [facility] for avulsion fracture of the right lateral malleolus.  He was "chasing" after a grandchild outside and rolled his ankle when stepping down from a curb.  He waited two days before being seen at urgent care two weeks ago for lateral ankle pain and swelling that persisted despite rest and ice.  He does not take any medications for pain and is able to tolerate what pain he is having.  He has been using an immobilizing boot for the past two weeks and has noticed a drastic improvement in pain and swelling since then.     Past medical history, Surgical history, Family history not pertinant except as noted below, Social history, Allergies, and medications have been entered into the medical record, reviewed, and no changes needed.   Review of Systems: No headache, visual changes, nausea, vomiting, diarrhea, constipation, dizziness, abdominal pain, skin rash, fevers, chills, night sweats, weight loss, swollen lymph nodes, body aches, joint swelling, muscle aches, chest pain, shortness of breath, mood changes, visual or auditory hallucinations.   Objective:   General: Well Developed, well nourished, and in no acute distress.  Neuro/Psych: Alert and oriented x3, extra-ocular muscles intact, able to move all 4 extremities, sensation grossly intact. Skin: Warm and dry, no rashes noted.  Respiratory: Not using accessory muscles, speaking in full sentences, trachea midline.  Cardiovascular: Pulses palpable, no extremity edema. Abdomen: Does not appear distended. Right Ankle: Mild swelling over the lateral malleolus and lateral foot. Range of motion is full in all directions. Strength is 5/5 in all directions. Stable lateral and medial ligaments; squeeze test and kleiger  test unremarkable; Talar dome nontender; No pain at base of 5th MT; No tenderness over cuboid; No tenderness over N spot or navicular prominence No tenderness on posterior aspects of lateral and medial malleolus No sign of peroneal tendon subluxations or tenderness to palpation Negative tarsal tunnel tinel's  X-rays show a small avulsion fracture from the tip of the lateral malleolus.  Impression and Recommendations:   This case required medical decision making of moderate complexity.  Small avulsion fracture of lateral malleolus of right ankle.  He is doing well with improvement in pain and swelling with immobilization.  -Continue boot immobilization for 2 weeks -transition to ASO brace in two weeks -tylenol prn for pain -encourage calcium and vitamin D supplement -f/u in 4 weeks

## 2013-12-07 NOTE — Assessment & Plan Note (Signed)
Fractures mild, can be treated as a severe sprain. Continue cast boot for additional 2 weeks then transitioned into a lace up-ASO brace. Avoid NSAIDs, home rehabilitation given. Return to see me in 4 weeks, I will likely turn him loose after that. Calcium and vitamin D supplement twice a day.  I billed a fracture code for this visit, all subsequent visits for this complaint will be "post-op checks" in the global period.

## 2014-01-04 ENCOUNTER — Ambulatory Visit (INDEPENDENT_AMBULATORY_CARE_PROVIDER_SITE_OTHER): Payer: BC Managed Care – PPO | Admitting: Sports Medicine

## 2014-01-04 ENCOUNTER — Encounter: Payer: Self-pay | Admitting: Sports Medicine

## 2014-01-04 VITALS — BP 134/87 | HR 85 | Ht 68.0 in | Wt 243.0 lb

## 2014-01-04 DIAGNOSIS — S8263XA Displaced fracture of lateral malleolus of unspecified fibula, initial encounter for closed fracture: Secondary | ICD-10-CM

## 2014-01-04 DIAGNOSIS — S8261XA Displaced fracture of lateral malleolus of right fibula, initial encounter for closed fracture: Secondary | ICD-10-CM

## 2014-01-04 NOTE — Assessment & Plan Note (Signed)
Clinically resolved, return as needed. 

## 2014-01-04 NOTE — Progress Notes (Signed)
  Subjective: 4 weeks post fracture of the tip of the lateral malleolus, pain-free.   Objective: General: Well-developed, well-nourished, and in no acute distress. Right Ankle: Minimally swollen, but no tenderness over the fracture site. Range of motion is full in all directions. Strength is 5/5 in all directions. Stable lateral and medial ligaments; squeeze test and kleiger test unremarkable; Talar dome nontender; No pain at base of 5th MT; No tenderness over cuboid; No tenderness over N spot or navicular prominence No tenderness on posterior aspects of lateral and medial malleolus No sign of peroneal tendon subluxations or tenderness to palpation Negative tarsal tunnel tinel's Able to walk 4 steps.  Assessment/plan:

## 2014-06-22 ENCOUNTER — Encounter: Payer: Self-pay | Admitting: *Deleted

## 2014-06-22 ENCOUNTER — Emergency Department
Admission: EM | Admit: 2014-06-22 | Discharge: 2014-06-22 | Disposition: A | Discharge status: Home / Self Care | Payer: BC Managed Care – PPO | Source: Home / Self Care | Attending: Emergency Medicine | Admitting: Emergency Medicine

## 2014-06-22 ENCOUNTER — Emergency Department (INDEPENDENT_AMBULATORY_CARE_PROVIDER_SITE_OTHER): Payer: BC Managed Care – PPO

## 2014-06-22 DIAGNOSIS — S83411A Sprain of medial collateral ligament of right knee, initial encounter: Secondary | ICD-10-CM

## 2014-06-22 DIAGNOSIS — W19XXXA Unspecified fall, initial encounter: Secondary | ICD-10-CM

## 2014-06-22 DIAGNOSIS — M25562 Pain in left knee: Secondary | ICD-10-CM

## 2014-06-22 NOTE — ED Notes (Signed)
Pt c/o LT knee pain x 3wks after twisting at home. He has worn a OTC knee sleeve x 1 wk with some relief.

## 2014-06-22 NOTE — ED Provider Notes (Signed)
CSN: [id number]     Arrival date & time 06/22/14  1725 History   First MD Initiated Contact with Patient 06/22/14 1726     Chief Complaint  Patient presents with  . Knee Pain   (Consider location/radiation/quality/duration/timing/severity/associated sxs/prior Treatment) HPI Pt c/o LT knee pain x 3wks after twisting at home. He has worn a OTC knee sleeve x 1 wk with some relief. The need pain is mild-moderate, occasionally moderate-severe with movement. He describes his sharp and throbbing at times. He denies that it has locked were given out. Past Medical History  Diagnosis Date  . Tachycardia   . Elevated BP   . Squamous carcinoma   . Folliculitis   . Hypertension   . Hyperlipemia    Past Surgical History  Procedure Laterality Date  . Skin cancer destruction    . Skin cancer excision    . Coronary angioplasty with stent placement     Family History  Problem Relation Age of Onset  . Lung cancer Mother   . Heart disease Father   . Prostate cancer Father   . Hyperlipidemia Brother   . Colon cancer Neg Hx   . Throat cancer Neg Hx   . Diabetes Father   . Kidney disease Neg Hx   . Liver disease Neg Hx    History  Substance Use Topics  . Smoking status: Current Every Day Smoker -- 0.50 packs/day    Types: Cigarettes  . Smokeless tobacco: Never Used     Comment: pt attempting to d/c; form given 07-30-13  . Alcohol Use: 0.0 oz/week     Comment: 1 drink daily    Review of Systems  All other systems reviewed and are negative.   Allergies  Doxycycline  Home Medications   Prior to Admission medications   Medication Sig Start Date End Date Taking? Authorizing Provider  aspirin 81 MG tablet Take 81 mg by mouth daily.    Historical Provider, MD  atorvastatin (LIPITOR) 80 MG tablet Take 1 tablet (80 mg total) by mouth daily. 06/21/13   Sean Hommel, DO  co-enzyme Q-10 30 MG capsule Take 30 mg by mouth 3 (three) times daily.    Historical Provider, MD  fish oil-omega-3  fatty acids 1000 MG capsule Take 2 g by mouth daily.    Historical Provider, MD  Melatonin 3 MG TABS Take 5 tablets by mouth at bedtime as needed.     Historical Provider, MD  metoprolol (LOPRESSOR) 50 MG tablet Take 50 mg by mouth 2 (two) times daily.  01/19/13   Laren BoomSean Hommel, DO  Multiple Vitamin (MULTIVITAMIN) tablet Take 1 tablet by mouth daily.    Historical Provider, MD  prasugrel (EFFIENT) 10 MG TABS Take 1 tablet (10 mg total) by mouth daily. 01/19/13   Sean Hommel, DO   BP 151/92 mmHg  Pulse 78  Temp(Src) 98.3 F (36.8 C) (Oral)  Resp 18  Ht 5\' 8"  (1.727 m)  Wt 243 lb (110.224 kg)  BMI 36.96 kg/m2  SpO2 97% Physical Exam  Constitutional: He is oriented to person, place, and time. He appears well-developed and well-nourished. No distress.  HENT:  Head: Normocephalic and atraumatic.  Eyes: Conjunctivae and EOM are normal. Pupils are equal, round, and reactive to light. No scleral icterus.  Neck: Normal range of motion.  Cardiovascular: Normal rate.   Pulmonary/Chest: Effort normal.  Abdominal: He exhibits no distension.  Musculoskeletal: Normal range of motion.  Right knee with decreased range of motion. Mild diffuse tenderness and  swelling, especially tender over medial joint line. but no definite effusion. No instability. Negative Lachman sign. Negative McMurray sign  Neurological: He is alert and oriented to person, place, and time.  Skin: Skin is warm.  Psychiatric: He has a normal mood and affect.  Nursing note and vitals reviewed.  Right lower extremity: neurovascular distally intact. No calf tenderness or cords or heat.  ED Course  Procedures (including critical care time) Labs Review Labs Reviewed - No data to display  Imaging Review Dg Knee Complete 4 Views Left  06/22/2014   CLINICAL DATA:  Pain and swelling after twisting injury 3 weeks prior  EXAM: LEFT KNEE - COMPLETE 4+ VIEW  COMPARISON:  None.  FINDINGS: Frontal, lateral, and bilateral oblique views were  obtained. No fracture or dislocation. Joint spaces appear intact. No erosive change.  IMPRESSION: No abnormality noted.   Electronically Signed   By: Bretta BangWilliam  Woodruff M.D.   On: 06/22/2014 18:05     MDM   1. Sprain of medial collateral ligament of knee, right, initial encounter   2. Fall    Treatment options discussed, as well as risks, benefits, alternatives. Patient voiced understanding and agreement with the following plans:  He prefers to use Ace and elastic knee sleeve that he has at home. Ibuprofen. He declined any prescription pain meds. Follow-up with Dr. Karie Schwalbe if not better in 1-2 weeks, sooner if worse   Lajean Manesavid Massey, MD 06/24/14 1452

## 2014-06-27 ENCOUNTER — Ambulatory Visit: Payer: BC Managed Care – PPO | Admitting: Sports Medicine

## 2014-07-22 ENCOUNTER — Telehealth: Payer: Self-pay | Admitting: Family Medicine

## 2014-07-22 DIAGNOSIS — I2510 Atherosclerotic heart disease of native coronary artery without angina pectoris: Secondary | ICD-10-CM

## 2014-07-22 MED ORDER — ATORVASTATIN CALCIUM 80 MG PO TABS: 80.0000 mg | ORAL_TABLET | Freq: Every day | ORAL | Status: DC

## 2014-07-22 NOTE — Telephone Encounter (Signed)
Needs f/u.

## 2014-08-01 ENCOUNTER — Ambulatory Visit (INDEPENDENT_AMBULATORY_CARE_PROVIDER_SITE_OTHER): Payer: BC Managed Care – PPO | Admitting: Family Medicine

## 2014-08-01 ENCOUNTER — Encounter: Payer: Self-pay | Admitting: Family Medicine

## 2014-08-01 VITALS — BP 133/85 | HR 75 | Wt 246.0 lb

## 2014-08-01 DIAGNOSIS — M25562 Pain in left knee: Secondary | ICD-10-CM | POA: Diagnosis not present

## 2014-08-01 DIAGNOSIS — I2510 Atherosclerotic heart disease of native coronary artery without angina pectoris: Secondary | ICD-10-CM

## 2014-08-01 DIAGNOSIS — I10 Essential (primary) hypertension: Secondary | ICD-10-CM | POA: Diagnosis not present

## 2014-08-01 DIAGNOSIS — E785 Hyperlipidemia, unspecified: Secondary | ICD-10-CM

## 2014-08-01 DIAGNOSIS — S7292XA Unspecified fracture of left femur, initial encounter for closed fracture: Secondary | ICD-10-CM | POA: Diagnosis not present

## 2014-08-01 MED ORDER — TRAMADOL HCL 50 MG PO TABS
50.0000 mg | ORAL_TABLET | Freq: Three times a day (TID) | ORAL | Status: DC | PRN
Start: 2014-08-01 — End: 2016-02-07

## 2014-08-01 MED ORDER — ATORVASTATIN CALCIUM 80 MG PO TABS: 80.0000 mg | ORAL_TABLET | Freq: Every day | ORAL | Status: DC

## 2014-08-01 NOTE — Progress Notes (Signed)
CC: Craig Mccormick is a 58 y.o. male is here for Hypertension   Subjective: HPI:  Hyperlipidemia: Continues on atorvastatin 80 mg daily. Without myalgias or right upper quadrant pain. He had liver function tests back from July which was normal. LDL cholesterol in July was less than 100.  No formal exercise routine. Physical activity is limited to his left knee pain. Denies any exertional chest pain, nor rest chest pain.  Follow-up essential hypertension: No outside blood pressures to report. Continues on metoprolol twice a day. Denies chest pain shortness of breath orthopnea nor peripheral edema.  Complains of left knee pain that has been present on a daily basis for the last 5 weeks. He was initially diagnosed with an MCL strain however rest has not helped any recovering he believes it is actually getting worse. Symptoms are moderate to severe in severity. Localized on the anterior medial aspect of the knee radiating to the knee. Symptoms are absent at rest or when sitting however significantly worse when walking, extending leg to complete extension, or when getting from a seated to standing position. He tells me it feels unstable and has "gone out" on him multiple times. Denies catching or locking. Interventions have been ibuprofen with only mild improvement of his pain denies swelling or overlying skin changes at the site of discomfort. Denies any pain elsewhere   Review Of Systems Outlined In HPI  Past Medical History  Diagnosis Date  . Tachycardia   . Elevated BP   . Squamous carcinoma   . Folliculitis   . Hypertension   . Hyperlipemia     Past Surgical History  Procedure Laterality Date  . Skin cancer destruction    . Skin cancer excision    . Coronary angioplasty with stent placement     Family History  Problem Relation Age of Onset  . Lung cancer Mother   . Heart disease Father   . Prostate cancer Father   . Hyperlipidemia Brother   . Colon cancer Neg Hx   . Throat cancer  Neg Hx   . Diabetes Father   . Kidney disease Neg Hx   . Liver disease Neg Hx     History   Social History  . Marital Status: Married    Spouse Name: N/A    Number of Children: N/A  . Years of Education: N/A   Occupational History  . Not on file.   Social History Main Topics  . Smoking status: Current Every Day Smoker -- 0.50 packs/day    Types: Cigarettes  . Smokeless tobacco: Never Used     Comment: pt attempting to d/c; form given 07-30-13  . Alcohol Use: 0.0 oz/week     Comment: 1 drink daily  . Drug Use: No  . Sexual Activity: Yes    Birth Control/ Protection: Surgical   Other Topics Concern  . Not on file   Social History Narrative     Objective: BP 133/85 mmHg  Pulse 75  Wt 246 lb (111.585 kg)  General: Alert and Oriented, No Acute Distress HEENT: Pupils equal, round, reactive to light. Conjunctivae clear.   Lungs: Clear to company worker breathing Cardiac: Regular rate and rhythm. Left knee exam shows full-strength and range of motion. There is no swelling, redness, nor warmth overlying the knee.  No patellar crepitus. No patellar apprehension. No pain with palpation of the inferior patellar pole.  No  laxity with valgus nor varus stress. No pain with varus stress however valgus stress does reproduce  his presenting pain mildly.Marland Kitchen. Anterior drawer is negative. McMurray's positive. No popliteal space tenderness or palpable mass. No medial or lateral joint line tenderness to palpation. Extremities: No peripheral edema.  Strong peripheral pulses.  Mental Status: No depression, anxiety, nor agitation. Skin: Warm and dry.  Assessment & Plan: Craig Mccormick was seen today for hypertension.  Diagnoses and associated orders for this visit:  Hyperlipidemia  Essential hypertension  Coronary artery disease involving native coronary artery of native heart without angina pectoris - atorvastatin (LIPITOR) 80 MG tablet; Take 1 tablet (80 mg total) by mouth daily.  Left knee  pain - MR Knee Left  Wo Contrast; Future - traMADol (ULTRAM) 50 MG tablet; Take 1 tablet (50 mg total) by mouth every 8 (eight) hours as needed.    Hyperlipidemia: Controlled however ideally his LDL should be below 70, discussed exercise interventions once we get his knee pain improved. Essential hypertension: Controlled continue metoprolol Left knee pain: I got a concern for meniscal tear and therefore an MRI has been placed to see if he needs surgical intervention. Continue ibuprofen (tramadol as needed to mask pain)   Return if symptoms worsen or fail to improve.

## 2014-08-02 ENCOUNTER — Telehealth: Payer: Self-pay | Admitting: *Deleted

## 2014-08-02 NOTE — Telephone Encounter (Signed)
MRI approval for knee [id number] valid 08/02/14-08-31-14. Craig SkainsJamie Nayshawn Mccormick, CMA

## 2014-08-08 ENCOUNTER — Ambulatory Visit (INDEPENDENT_AMBULATORY_CARE_PROVIDER_SITE_OTHER): Payer: BC Managed Care – PPO

## 2014-08-08 DIAGNOSIS — M25562 Pain in left knee: Secondary | ICD-10-CM

## 2014-08-08 DIAGNOSIS — M6588 Other synovitis and tenosynovitis, other site: Secondary | ICD-10-CM

## 2014-08-08 DIAGNOSIS — M25462 Effusion, left knee: Secondary | ICD-10-CM

## 2014-08-09 ENCOUNTER — Telehealth: Payer: Self-pay | Admitting: Family Medicine

## 2014-08-09 DIAGNOSIS — S7291XA Unspecified fracture of right femur, initial encounter for closed fracture: Secondary | ICD-10-CM

## 2014-08-09 DIAGNOSIS — S7292XA Unspecified fracture of left femur, initial encounter for closed fracture: Secondary | ICD-10-CM | POA: Insufficient documentation

## 2014-08-09 NOTE — Telephone Encounter (Signed)
[name] called back and scheduled for nurse visit.

## 2014-08-09 NOTE — Telephone Encounter (Signed)
Andrea/Jamie, Will you please let patient know that his MRI actually showed a fracture where he's been localizing his pain.  Fortunately this type of fracture responds well to immobilizing the knee and using crutches for three weeks.  Will you please provide him with these supplies via a nurse visit?  Also if he would like a refill on tramadol or something stronger for pain I'd be happy to provide it.  He'll need to focus on being 100% non weight bearing on the left knee using crutches and to wear the knee / leg immobilizer anytime he's not bathing. F/u with me one week after these interventions.

## 2014-08-09 NOTE — Progress Notes (Signed)
MRI shows fracture of L medial femoral condyle.  Advised non weight bearing with crutches and knee immobilizer for three weeks, fracture code has been added to this encounter.

## 2014-08-09 NOTE — Telephone Encounter (Signed)
Voicemail left for patient to return call regarding MRI results.

## 2014-08-10 ENCOUNTER — Ambulatory Visit (INDEPENDENT_AMBULATORY_CARE_PROVIDER_SITE_OTHER): Payer: BC Managed Care – PPO | Admitting: Family Medicine

## 2014-08-10 VITALS — BP 140/85 | HR 80 | Wt 246.0 lb

## 2014-08-10 DIAGNOSIS — S7292XD Unspecified fracture of left femur, subsequent encounter for closed fracture with routine healing: Secondary | ICD-10-CM

## 2014-08-10 NOTE — Progress Notes (Signed)
   Subjective:    Patient ID: Craig Mccormick, male    DOB: [birth year], 58 y.o.   MRN: [id number]  HPI  Craig NeedleMichael was contacted yesterday regarding his MRI results and came in to office today for knee immobilizer to treat his knee fracture. He presented to office with his own crutches. Immobilizer was placed without complication and directions for use were reviewed.    Review of Systems     Objective:   Physical Exam        Assessment & Plan:

## 2015-02-08 ENCOUNTER — Other Ambulatory Visit: Payer: Self-pay | Admitting: Family Medicine

## 2015-02-08 DIAGNOSIS — I2510 Atherosclerotic heart disease of native coronary artery without angina pectoris: Secondary | ICD-10-CM

## 2015-02-09 ENCOUNTER — Encounter: Payer: Self-pay | Admitting: Family Medicine

## 2015-02-09 DIAGNOSIS — IMO0001 Reserved for inherently not codable concepts without codable children: Secondary | ICD-10-CM | POA: Insufficient documentation

## 2015-02-17 ENCOUNTER — Encounter: Payer: Self-pay | Admitting: Family Medicine

## 2016-02-07 ENCOUNTER — Encounter: Payer: Self-pay | Admitting: Family Medicine

## 2016-02-07 ENCOUNTER — Ambulatory Visit (INDEPENDENT_AMBULATORY_CARE_PROVIDER_SITE_OTHER): Payer: BLUE CROSS/BLUE SHIELD | Admitting: Family Medicine

## 2016-02-07 VITALS — BP 130/90 | HR 66 | Wt 238.0 lb

## 2016-02-07 DIAGNOSIS — R059 Cough, unspecified: Secondary | ICD-10-CM

## 2016-02-07 DIAGNOSIS — R05 Cough: Secondary | ICD-10-CM

## 2016-02-07 MED ORDER — BUDESONIDE-FORMOTEROL FUMARATE 80-4.5 MCG/ACT IN AERO: 2.0000 | INHALATION_SPRAY | Freq: Two times a day (BID) | RESPIRATORY_TRACT | Status: DC

## 2016-02-07 NOTE — Progress Notes (Signed)
CC: Craig Mccormick is a 60 y.o. male is here for Cough   Subjective: HPI:   Earlier this month he developed a productive cough with wheezing. He was seen at a local urgent care and given Augmentin and prednisone. He additionally has been using albuterol. Antibiotics and above helped systemic symptoms such as fatigue, shortness of breath, wheezing and overall feeling of sickness but he continues to have a cough which is now dry. Symptoms are slightly improved with albuterol but nothing else makes better or worse. He currently denies any fevers, chills, wheezing, chest discomfort or shortness of breath. Cough is moderate in severity and worse at night   Review Of Systems Outlined In HPI  Past Medical History  Diagnosis Date  . Tachycardia   . Elevated BP   . Squamous carcinoma (HCC)   . Folliculitis   . Hypertension   . Hyperlipemia     Past Surgical History  Procedure Laterality Date  . Skin cancer destruction    . Skin cancer excision    . Coronary angioplasty with stent placement     Family History  Problem Relation Age of Onset  . Lung cancer Mother   . Heart disease Father   . Prostate cancer Father   . Hyperlipidemia Brother   . Colon cancer Neg Hx   . Throat cancer Neg Hx   . Diabetes Father   . Kidney disease Neg Hx   . Liver disease Neg Hx     Social History   Social History  . Marital Status: Married    Spouse Name: N/A  . Number of Children: N/A  . Years of Education: N/A   Occupational History  . Not on file.   Social History Main Topics  . Smoking status: Current Every Day Smoker -- 0.50 packs/day    Types: Cigarettes  . Smokeless tobacco: Never Used     Comment: pt attempting to d/c; form given 07-30-13  . Alcohol Use: 0.0 oz/week     Comment: 1 drink daily  . Drug Use: No  . Sexual Activity: Yes    Birth Control/ Protection: Surgical   Other Topics Concern  . Not on file   Social History Narrative     Objective: BP 130/90 mmHg  Pulse 66   Wt 238 lb (107.956 kg)  General: Alert and Oriented, No Acute Distress HEENT: Pupils equal, round, reactive to light. Conjunctivae clear.  External ears unremarkable, canals clear with intact TMs with appropriate landmarks.  Middle ear appears open without effusion. Pink inferior turbinates.  Moist mucous membranes, pharynx without inflammation nor lesions.  Neck supple without palpable lymphadenopathy nor abnormal masses. Lungs: Clear to auscultation bilaterally, no wheezing/ronchi/rales.  Comfortable work of breathing. Good air movement. Cardiac: Regular rate and rhythm. Normal S1/S2.  No murmurs, rubs, nor gallops.   Extremities: No peripheral edema.  Strong peripheral pulses.  Mental Status: No depression, anxiety, nor agitation. Skin: Warm and dry.  Assessment & Plan: Craig NeedleMichael was seen today for cough.  Diagnoses and all orders for this visit:  Cough  Other orders -     budesonide-formoterol (SYMBICORT) 80-4.5 MCG/ACT inhaler; Inhale 2 puffs into the lungs 2 (two) times daily.   Reassurance provided that I don't see any signs of infection and it is not uncommon to have a cough after an episode of bronchitis for 2 weeks after the initial infection. Will try a sample of Symbicort to see if this helps reduce his cough frequency and severity.  Return if symptoms worsen or fail to improve.

## 2016-03-22 ENCOUNTER — Ambulatory Visit (INDEPENDENT_AMBULATORY_CARE_PROVIDER_SITE_OTHER): Payer: BLUE CROSS/BLUE SHIELD | Admitting: Osteopathic Medicine

## 2016-03-22 ENCOUNTER — Encounter: Payer: Self-pay | Admitting: Osteopathic Medicine

## 2016-03-22 VITALS — BP 121/81 | HR 84 | Ht 68.0 in | Wt 235.0 lb

## 2016-03-22 DIAGNOSIS — J019 Acute sinusitis, unspecified: Secondary | ICD-10-CM

## 2016-03-22 DIAGNOSIS — R05 Cough: Secondary | ICD-10-CM | POA: Diagnosis not present

## 2016-03-22 DIAGNOSIS — R059 Cough, unspecified: Secondary | ICD-10-CM

## 2016-03-22 MED ORDER — FLUTICASONE FUROATE-VILANTEROL 100-25 MCG/INH IN AEPB: 1.0000 | INHALATION_SPRAY | Freq: Every day | RESPIRATORY_TRACT | 6 refills | Status: DC

## 2016-03-22 MED ORDER — GUAIFENESIN-CODEINE 100-10 MG/5ML PO SYRP: 5.0000 mL | ORAL_SOLUTION | Freq: Every evening | ORAL | 0 refills | Status: DC | PRN

## 2016-03-22 MED ORDER — AMOXICILLIN-POT CLAVULANATE 875-125 MG PO TABS: 1.0000 | ORAL_TABLET | Freq: Two times a day (BID) | ORAL | 0 refills | Status: DC

## 2016-03-22 MED ORDER — IPRATROPIUM BROMIDE 0.03 % NA SOLN: 2.0000 | Freq: Three times a day (TID) | NASAL | 0 refills | Status: DC | PRN

## 2016-03-22 NOTE — Patient Instructions (Signed)
Cough - Sinus infection / postnasal drip - treat with antibiotics and nasal spray to reduce cough reflex and sinus pressure.  - Persistent cough / possible COPD - will switch inhalers and consider repeat lung function tests and chest X-rays depending on how you're dong.

## 2016-03-22 NOTE — Progress Notes (Signed)
HPI: Craig Mccormick is a 60 y.o. Not Hispanic or Latino male  who presents to [facility] today, 03/22/16,  for chief complaint of:  Chief Complaint  Patient presents with  . Establish Care    cough     . Context: recently seen 02/07/16 by Dr Ivan Anchors, Tx as post-bronchitis cough, initiates Symbicort 2 puffs bid. No CXR on file here but see below for CXR from [facility] 01/28/16. Symbicort was kind of helping but had stopped taking / forgot the afternoon dose a lot.  . Location: chest and sinus issues . Quality: productive coughing.  . Duration: over past year, has happened 3 times     XR Chest Pa And Lateral 01/28/2016 [facility] Result Impression  IMPRESSION:  1.No acute cardiopulmonary disease process is identified.   Result Narrative  ADDITIONAL COMPARISON:12/21/12 INDICATION: R05: Cough Z72.0: Tobacco use TECHNIQUE: Standard 2 view chest.  FINDINGS:  #No pulmonary consolidations. #No detectable pleural effusions. #No pneumothorax. #Intact cardiomediastinal silhouette. #No acute osseous abnormality.     Past medical, surgical, social and family history reviewed: Past Medical History:  Diagnosis Date  . Elevated BP   . Folliculitis   . Hyperlipemia   . Hypertension   . Squamous carcinoma (HCC)   . Tachycardia    Past Surgical History:  Procedure Laterality Date  . CORONARY ANGIOPLASTY WITH STENT PLACEMENT    . SKIN CANCER DESTRUCTION    . SKIN CANCER EXCISION     Social History  Substance Use Topics  . Smoking status: Current Every Day Smoker    Packs/day: 0.50    Types: Cigarettes  . Smokeless tobacco: Never Used     Comment: pt attempting to d/c; form given 07-30-13  . Alcohol use 0.0 oz/week     Comment: 1 drink daily   Family History  Problem Relation Age of Onset  . Lung cancer Mother   . Heart disease Father   . Prostate cancer Father   . Hyperlipidemia Brother   . Colon cancer Neg Hx   .  Throat cancer Neg Hx   . Diabetes Father   . Kidney disease Neg Hx   . Liver disease Neg Hx      Current medication list and allergy/intolerance information reviewed:   Current Outpatient Prescriptions  Medication Sig Dispense Refill  . aspirin 81 MG tablet Take 81 mg by mouth daily.    . budesonide-formoterol (SYMBICORT) 80-4.5 MCG/ACT inhaler Inhale 2 puffs into the lungs 2 (two) times daily. 1 Inhaler 0  . clopidogrel (PLAVIX) 75 MG tablet Take 75 mg by mouth daily.    Marland Kitchen co-enzyme Q-10 30 MG capsule Take 30 mg by mouth 3 (three) times daily.    . fish oil-omega-3 fatty acids 1000 MG capsule Take 2 g by mouth daily.    . Melatonin 3 MG TABS Take 5 tablets by mouth at bedtime as needed.     . metoprolol (LOPRESSOR) 50 MG tablet Take 50 mg by mouth 2 (two) times daily.  30 tablet 11  . Multiple Vitamin (MULTIVITAMIN) tablet Take 1 tablet by mouth daily.    . rosuvastatin (CRESTOR) 40 MG tablet Take 1 tablet (40 mg total) by mouth daily. 90 tablet 3   No current facility-administered medications for this visit.    Allergies  Allergen Reactions  . Doxycycline     Vomiting      Review of Systems:  Constitutional:  No  fever, no chills, No unintentional weight changes. No significant  fatigue.   HEENT: No  headache, no vision change, no hearing change, No sore throat, (+) sinus pressure  Cardiac: No  chest pain, No  pressure, No palpitations,  Respiratory:  No  shortness of breath. (+) Cough  Gastrointestinal: No  abdominal pain, No  nausea, No  vomiting,   Musculoskeletal: No new myalgia/arthralgia  Skin: No  Rash  Exam:  BP 121/81   Pulse 84   Ht  (1.727 m)   Wt 235 lb (106.6 kg)   BMI 35.73 kg/m   Constitutional: VS see above. General Appearance: alert, well-developed, well-nourished, NAD  Eyes: Normal lids and conjunctive, non-icteric sclera  Ears, Nose, Mouth, Throat: MMM, Normal external inspection ears/nares/mouth/lips/gums. TM normal bilaterally.  Pharynx/tonsils no erythema, no exudate. Nasal mucosa normal.   Neck: No masses, trachea midline. No thyroid enlargement. No tenderness/mass appreciated. No lymphadenopathy  Respiratory: Normal respiratory effort. no wheeze, no rhonchi, no rales  Cardiovascular: S1/S2 normal, no murmur, no rub/gallop auscultated. RRR    ASSESSMENT/PLAN:   Patient having difficulty remembering to take twice a day inhaler, will switch to [facility]. Patient would like to avoid steroids if possible, this isn't sound like a significant COPD exacerbation but certainly consider it.  Consider repeat chest x-ray/pulmonary function test if no improvement, patient advised to call ahead so we can get him on the nurse visit for PFT if he is not feeling better within the next few weeks.  Acute sinusitis, recurrence not specified, unspecified location - Plan: amoxicillin-clavulanate (AUGMENTIN) 875-125 MG tablet, ipratropium (ATROVENT) 0.03 % nasal spray  Cough - Plan: fluticasone furoate-vilanterol (BREO ELLIPTA) 100-25 MCG/INH AEPB, guaiFENesin-codeine (ROBITUSSIN AC) 100-10 MG/5ML syrup     Visit summary with medication list and pertinent instructions was printed for patient to review. All questions at time of visit were answered - patient instructed to contact office with any additional concerns. ER/RTC precautions were reviewed with the patient. Follow-up plan: Return in about 2 weeks (around 04/05/2016), or if symptoms worsen or fail to improve, for COUGHING IF NO BETTER IN 2 - 4 WEEKS.

## 2016-07-27 ENCOUNTER — Emergency Department
Admission: EM | Admit: 2016-07-27 | Discharge: 2016-07-27 | Disposition: A | Discharge status: Home / Self Care | Payer: BLUE CROSS/BLUE SHIELD | Source: Home / Self Care | Attending: Emergency Medicine | Admitting: Emergency Medicine

## 2016-07-27 ENCOUNTER — Encounter: Payer: Self-pay | Admitting: Emergency Medicine

## 2016-07-27 DIAGNOSIS — J0100 Acute maxillary sinusitis, unspecified: Secondary | ICD-10-CM

## 2016-07-27 DIAGNOSIS — J209 Acute bronchitis, unspecified: Secondary | ICD-10-CM

## 2016-07-27 MED ORDER — FLUTICASONE FUROATE-VILANTEROL 200-25 MCG/INH IN AEPB: 1.0000 | INHALATION_SPRAY | Freq: Every day | RESPIRATORY_TRACT | 0 refills | Status: DC

## 2016-07-27 MED ORDER — BENZONATATE 200 MG PO CAPS: ORAL_CAPSULE | ORAL | 0 refills | Status: DC

## 2016-07-27 MED ORDER — AMOXICILLIN-POT CLAVULANATE 875-125 MG PO TABS: 1.0000 | ORAL_TABLET | Freq: Two times a day (BID) | ORAL | 0 refills | Status: DC

## 2016-07-27 NOTE — ED Provider Notes (Signed)
[facility]    CSN: [id number] Arrival date & time: 07/27/16  1429     History   Chief Complaint Chief Complaint  Patient presents with  . Nasal Congestion  . Cough    HPI Craig Mccormick is a 60 y.o. male.   HPI Patient reports congestion of sinuses and upper chest for past week. Low-grade fever with discolored nasal discharge and cough occasionally productive of yellow sputum. No hemoptysis. Has not tried any new medications for this. Two previous episodes in past year; antiibiotics and inhaler were necessary and effective. He states that chest x-ray this past year was within normal limits. He continues to smoke, he states he's cut back to one half pack a day. He denies history of asthma or COPD, although some prior PCP notes suggest possible diagnosis of COPD. Past Medical History:  Diagnosis Date  . Elevated BP   . Folliculitis   . Hyperlipemia   . Hypertension   . Squamous carcinoma   . Tachycardia     Patient Active Problem List   Diagnosis Date Noted  . Aortic sclerosis-mild 02/09/2015  . Closed fracture of left femur (HCC) 08/09/2014  . Closed fracture of lateral malleolus of right ankle 12/07/2013  . Preventative health care 08/27/2013  . Hyperlipidemia 06/18/2013  . CAD (coronary artery disease) 12/23/2012  . S/P PTCA (percutaneous transluminal coronary angioplasty) 12/23/2012  . Hypertension 06/11/2012    Past Surgical History:  Procedure Laterality Date  . CORONARY ANGIOPLASTY WITH STENT PLACEMENT    . SKIN CANCER DESTRUCTION    . SKIN CANCER EXCISION         Home Medications    Prior to Admission medications   Medication Sig Start Date End Date Taking? Authorizing Provider  amoxicillin-clavulanate (AUGMENTIN) 875-125 MG tablet Take 1 tablet by mouth 2 (two) times daily. For 10 days. Take with food. 07/27/16   Lajean Manes, MD  aspirin 81 MG tablet Take 81 mg by mouth daily.    Historical Provider, MD  benzonatate (TESSALON) 200 MG  capsule Take 1 every 8 hours as needed for cough. 07/27/16   Lajean Manes, MD  clopidogrel (PLAVIX) 75 MG tablet Take 75 mg by mouth daily.    Historical Provider, MD  co-enzyme Q-10 30 MG capsule Take 30 mg by mouth 3 (three) times daily.    Historical Provider, MD  fish oil-omega-3 fatty acids 1000 MG capsule Take 2 g by mouth daily.    Historical Provider, MD  fluticasone furoate-vilanterol (BREO ELLIPTA) 200-25 MCG/INH AEPB Inhale 1 puff into the lungs daily. 07/27/16   Lajean Manes, MD  Melatonin 3 MG TABS Take 5 tablets by mouth at bedtime as needed.     Historical Provider, MD  metoprolol (LOPRESSOR) 50 MG tablet Take 50 mg by mouth 2 (two) times daily.  01/19/13   Laren Boom, DO  Multiple Vitamin (MULTIVITAMIN) tablet Take 1 tablet by mouth daily.    Historical Provider, MD  rosuvastatin (CRESTOR) 40 MG tablet Take 1 tablet (40 mg total) by mouth daily. 02/08/15   Laren Boom, DO    Family History Family History  Problem Relation Age of Onset  . Lung cancer Mother   . Heart disease Father   . Prostate cancer Father   . Diabetes Father   . Hyperlipidemia Brother   . Colon cancer Neg Hx   . Throat cancer Neg Hx   . Kidney disease Neg Hx   . Liver disease Neg Hx     Social  History Social History  Substance Use Topics  . Smoking status: Current Every Day Smoker    Packs/day: 0.50    Types: Cigarettes  . Smokeless tobacco: Never Used     Comment: pt attempting to d/c; form given 07-30-13  . Alcohol use 0.0 oz/week     Comment: 1 drink daily     Allergies   Doxycycline   Review of Systems Review of Systems  All other systems reviewed and are negative.    Physical Exam Triage Vital Signs ED Triage Vitals  Enc Vitals Group     BP 07/27/16 1518 137/91     Pulse Rate 07/27/16 1518 93     Resp 07/27/16 1518 18     Temp 07/27/16 1518 98.3 F (36.8 C)     Temp Source 07/27/16 1518 Oral     SpO2 07/27/16 1518 95 %     Weight 07/27/16 1518 232 lb (105.2 kg)     Height  07/27/16 1518 5\' 8"  (1.727 m)     Head Circumference --      Peak Flow --      Pain Score 07/27/16 1520 0     Pain Loc --      Pain Edu? --      Excl. in GC? --    No data found.   Updated Vital Signs BP 137/91 (BP Location: Left Arm)   Pulse 93   Temp 98.3 F (36.8 C) (Oral)   Resp 18   Ht 5\' 8"  (1.727 m)   Wt 232 lb (105.2 kg)   SpO2 95%   BMI 35.28 kg/m   Visual Acuity Right Eye Distance:   Left Eye Distance:   Bilateral Distance:    Right Eye Near:   Left Eye Near:    Bilateral Near:     Physical Exam  Constitutional: He is oriented to person, place, and time. He appears well-developed and well-nourished. No distress.  HENT:  Head: Normocephalic and atraumatic.  Right Ear: Tympanic membrane, external ear and ear canal normal.  Left Ear: Tympanic membrane, external ear and ear canal normal.  Nose: Mucosal edema and rhinorrhea present. Right sinus exhibits maxillary sinus tenderness. Left sinus exhibits maxillary sinus tenderness.  Mouth/Throat: Oropharynx is clear and moist. No oral lesions. No oropharyngeal exudate.  Eyes: Right eye exhibits no discharge. Left eye exhibits no discharge. No scleral icterus.  Neck: Neck supple.  Cardiovascular: Normal rate, regular rhythm and normal heart sounds.   Pulmonary/Chest: Effort normal. No respiratory distress. He has wheezes (Rare, scattered, late expiratory. Fairly good air movement bilaterally. Breath sounds equal bilaterally.). He has rhonchi. He has no rales.  Lymphadenopathy:    He has no cervical adenopathy.  Neurological: He is alert and oriented to person, place, and time.  Skin: Skin is warm and dry.  Nursing note and vitals reviewed.    UC Treatments / Results  Labs (all labs ordered are listed, but only abnormal results are displayed) Labs Reviewed - No data to display  EKG  EKG Interpretation None       Radiology No results found. He declined chest x-ray today. Procedures Procedures  (including critical care time)  Medications Ordered in UC Medications - No data to display   Initial Impression / Assessment and Plan / UC Course  I have reviewed the triage vital signs and the nursing notes.  Pertinent labs & imaging results that were available during my care of the patient were reviewed by me and considered in  my medical decision making (see chart for details).  Clinical Course      Final Clinical Impressions(s) / UC Diagnoses   Final diagnoses:  Acute bronchitis, unspecified organism  Acute maxillary sinusitis, recurrence not specified  Minimal wheezing. Pulse ox 95% room air.  New Prescriptions Discharge Medication List as of 07/27/2016  4:11 PM     amoxicillin-clavulanate (AUGMENTIN) 875-125 MG tablet Take 1 tablet by mouth 2 (two) times daily. For 10 days. Take with food. 20 tablet  Augmentin prescribed, as he states that was very effective antibiotic used in the past. He states that Virgel BouquetBreo has helped in the past, so I refilled that, one inhalation daily. benzonatate (TESSALON) 200 MG capsule Take 1 every 8 hours as needed for cough. 20 capsule  Follow-up with your primary care doctor in 5-7 days if not improving, or sooner if symptoms become worse. Precautions discussed. Urged him to quit smoking and explain risks of not doing so. Red flags discussed. Questions invited and answered. Patient voiced understanding and agreement.     Lajean Manesavid Massey, MD 07/27/16 2005

## 2016-07-27 NOTE — ED Triage Notes (Signed)
Patient reports congestion of sinuses and upper chest for past week. Two previous episodes in past year; antiibiotics and inhaler were necessary and effective.

## 2017-04-15 ENCOUNTER — Ambulatory Visit (INDEPENDENT_AMBULATORY_CARE_PROVIDER_SITE_OTHER): Payer: BLUE CROSS/BLUE SHIELD | Admitting: Osteopathic Medicine

## 2017-04-15 ENCOUNTER — Encounter: Payer: Self-pay | Admitting: Osteopathic Medicine

## 2017-04-15 VITALS — BP 138/93 | HR 70 | Ht 68.0 in | Wt 243.0 lb

## 2017-04-15 DIAGNOSIS — I10 Essential (primary) hypertension: Secondary | ICD-10-CM

## 2017-04-15 DIAGNOSIS — M25561 Pain in right knee: Secondary | ICD-10-CM | POA: Diagnosis not present

## 2017-04-15 DIAGNOSIS — M1710 Unilateral primary osteoarthritis, unspecified knee: Secondary | ICD-10-CM | POA: Insufficient documentation

## 2017-04-15 MED ORDER — DICLOFENAC SODIUM 1 % TD GEL: 4.0000 g | Freq: Four times a day (QID) | TRANSDERMAL | 11 refills | Status: DC

## 2017-04-15 NOTE — Progress Notes (Signed)
HPI: Craig Mccormick is a 61 y.o. male  who presents to [facility] today, 04/15/17,  for chief complaint of:  Chief Complaint  Patient presents with  . Knee Pain    RIGHT     . Context: lifting bags of fertilizer few weeks ago - climbing onto truck, jumping off truck - both knees were hurting but L knee is better while R knee getting worse. Reports Hx Knee OA b/l . Location: R knee medial . Quality: sharp pain, no instability  . Duration: few weeks      Past medical history, surgical history, social history and family history reviewed.  Patient Active Problem List   Diagnosis Date Noted  . Aortic sclerosis-mild 02/09/2015  . Closed fracture of left femur (HCC) 08/09/2014  . Closed fracture of lateral malleolus of right ankle 12/07/2013  . Preventative health care 08/27/2013  . Hyperlipidemia 06/18/2013  . CAD (coronary artery disease) 12/23/2012  . S/P PTCA (percutaneous transluminal coronary angioplasty) 12/23/2012  . Hypertension 06/11/2012    Current medication list and allergy/intolerance information reviewed.   Current Outpatient Prescriptions on File Prior to Visit  Medication Sig Dispense Refill  . aspirin 81 MG tablet Take 81 mg by mouth daily.    . clopidogrel (PLAVIX) 75 MG tablet Take 75 mg by mouth daily.    . fish oil-omega-3 fatty acids 1000 MG capsule Take 2 g by mouth daily.    . Melatonin 3 MG TABS Take 5 tablets by mouth at bedtime as needed.     . metoprolol (LOPRESSOR) 50 MG tablet Take 50 mg by mouth 2 (two) times daily.  30 tablet 11  . Multiple Vitamin (MULTIVITAMIN) tablet Take 1 tablet by mouth daily.    . rosuvastatin (CRESTOR) 40 MG tablet Take 1 tablet (40 mg total) by mouth daily. 90 tablet 3   No current facility-administered medications on file prior to visit.    Allergies  Allergen Reactions  . Doxycycline     Vomiting      Review of Systems:  Constitutional: No recent illness  HEENT: No   headache  Cardiac: No  chest pain  Respiratory:  No  shortness of breath.   Gastrointestinal: No  abdominal pain  Musculoskeletal: +myalgia/arthralgia  Skin: No  Rash  Neurologic: No  weakness  Exam:  BP (!) 138/93   Pulse 70   Ht 5\' 8"  (1.727 m)   Wt 243 lb (110.2 kg)   BMI 36.95 kg/m   Constitutional: VS see above. General Appearance: alert, well-developed, well-nourished, NAD  Eyes: Normal lids and conjunctive, non-icteric sclera  Ears, Nose, Mouth, Throat: MMM, Normal external inspection ears/nares/mouth/lips/gums.  Neck: No masses, trachea midline.   Respiratory: Normal respiratory effort.  Musculoskeletal: Gait normal. Symmetric and independent movement of all extremities. Questionable scant effusion R knee medially, (+)pain to palpation medial tibial plateau/meniscus, (+)mcmurrays, neg ant/post drawer, neg varus/valgus laxity   Neurological: Normal balance/coordination. No tremor.  Skin: warm, dry, intact.   Psychiatric: Normal judgment/insight. Normal mood and affect. Oriented x3.     ASSESSMENT/PLAN:   Right knee pain, unspecified chronicity - Suspect meniscal injury / arthritis exacerbation. Appreciate consult from Dr. Denyse Amass - pt to follow up with him or Dr. Karie Schwalbe if no better  - Plan: diclofenac sodium (VOLTAREN) 1 % GEL  Essential hypertension - better on recheck, previous values ok, will follow, likely up d/t pain  Arthritis of knee    Patient Instructions  If knee pain is not better  or if it gets worse, I would recommend follow-up with one of our sports medicine specialists (Dr Denyse Amass who you saw today, or Dr. Cherylann Parr Dr. Karie Schwalbe) for further evaluation in 2-4 weeks, sooner if you get worse or notice bad swelling. Just call our office and ask for an appointment for sports medicine!      Follow-up plan: Return for recheck knee with sports medicine as directed .  Visit summary with medication list and pertinent instructions was printed for patient  to review, alert [place] if any changes needed. All questions at time of visit were answered - patient instructed to contact office with any additional concerns. ER/RTC precautions were reviewed with the patient and understanding verbalized.

## 2017-04-15 NOTE — Patient Instructions (Addendum)
If knee pain is not better or if it gets worse, I would recommend follow-up with one of our sports medicine specialists (Dr Denyse Amass who you saw today, or Dr. Cherylann Parr Dr. Karie Schwalbe) for further evaluation in 2-4 weeks, sooner if you get worse or notice bad swelling. Just call our office and ask for an appointment for sports medicine!

## 2017-08-18 ENCOUNTER — Encounter: Payer: Self-pay | Admitting: Family Medicine

## 2017-08-18 ENCOUNTER — Ambulatory Visit (INDEPENDENT_AMBULATORY_CARE_PROVIDER_SITE_OTHER): Payer: BLUE CROSS/BLUE SHIELD

## 2017-08-18 ENCOUNTER — Ambulatory Visit: Payer: BLUE CROSS/BLUE SHIELD | Admitting: Family Medicine

## 2017-08-18 VITALS — BP 129/88 | HR 88 | Ht 68.0 in | Wt 241.0 lb

## 2017-08-18 DIAGNOSIS — G8929 Other chronic pain: Secondary | ICD-10-CM

## 2017-08-18 DIAGNOSIS — M25562 Pain in left knee: Secondary | ICD-10-CM | POA: Diagnosis not present

## 2017-08-18 DIAGNOSIS — M7712 Lateral epicondylitis, left elbow: Secondary | ICD-10-CM | POA: Insufficient documentation

## 2017-08-18 DIAGNOSIS — M25561 Pain in right knee: Secondary | ICD-10-CM

## 2017-08-18 NOTE — Progress Notes (Signed)
Craig Mccormick is a 61 y.o. male who [facility] today for right knee pain and left elbow pain.   Right Knee: Craig Mccormick notes pain in the right medial knee for 6 months.  This occurred without injury but does occur with some locking and catching.  He denies any radiating pain weakness or numbness.  He notes the pain is worse with activity and better with rest.  He was seen by his primary care provider in August for this issue.  He has had a trial of conservative management including home exercise program, relative rest, NSAIDs which have not helped very much.  He denies fevers or chills.  Symptoms are moderate and interferes with his ability to exercise normally.   Left elbow pain: Craig Mccormick notes pain in the left lateral elbow.  The pain is worse with resisted wrist extension and grip.  He denies any radiating pain.  Symptoms present for several weeks now.  Symptoms are mild.   Past Medical History:  Diagnosis Date  . Elevated BP   . Folliculitis   . Hyperlipemia   . Hypertension   . Squamous carcinoma   . Tachycardia    Past Surgical History:  Procedure Laterality Date  . CORONARY ANGIOPLASTY WITH STENT PLACEMENT    . SKIN CANCER DESTRUCTION    . SKIN CANCER EXCISION     Social History   Tobacco Use  . Smoking status: Current Every Day Smoker    Packs/day: 0.50    Types: Cigarettes  . Smokeless tobacco: Never Used  . Tobacco comment: pt attempting to d/c; form given 07-30-13  Substance Use Topics  . Alcohol use: Yes    Alcohol/week: 0.0 oz    Comment: 1 drink daily     ROS:  As above   Medications: Current Outpatient Medications  Medication Sig Dispense Refill  . aspirin 81 MG tablet Take 81 mg by mouth daily.    . clopidogrel (PLAVIX) 75 MG tablet Take 75 mg by mouth daily.    . fish oil-omega-3 fatty acids 1000 MG capsule Take 2 g by mouth daily.    . Melatonin 3 MG TABS Take 5 tablets by mouth at bedtime as  needed.     . metoprolol (LOPRESSOR) 50 MG tablet Take 50 mg by mouth 2 (two) times daily.  30 tablet 11  . Multiple Vitamin (MULTIVITAMIN) tablet Take 1 tablet by mouth daily.    . rosuvastatin (CRESTOR) 40 MG tablet Take 1 tablet (40 mg total) by mouth daily. 90 tablet 3   No current facility-administered medications for this visit.    Allergies  Allergen Reactions  . Doxycycline     Vomiting     Exam:  BP 129/88   Pulse 88   Ht 5\' 8"  (1.727 m)   Wt 241 lb (109.3 kg)   BMI 36.64 kg/m  General: Well Developed, well nourished, and in no acute distress.  Neuro/Psych: Alert and oriented x3, extra-ocular muscles intact, able to move all 4 extremities, sensation grossly intact. Skin: Warm and dry, no rashes noted.  Respiratory: Not using accessory muscles, speaking in full sentences, trachea midline.  Cardiovascular: Pulses palpable, no extremity edema. Abdomen: Does not appear distended. MSK:  Right knee normal-appearing no effusion. Range of motion 0-120 degrees. Tender to palpation medial joint line. Stable ligamentous exam. Positive medial McMurray's test. Normal extension and flexion strength.  Left elbow: Normal-appearing. Range of motion is full. Tender to palpation lateral epicondyle. Pain with  resisted wrist extension and finger extension. Grip strength is intact.     No results found for this or any previous visit (from the past 48 hour(s)). Dg Knee 1-2 Views Left  Result Date: 08/18/2017 CLINICAL DATA:  Knee pain. EXAM: LEFT KNEE - 1-2 VIEW COMPARISON:  MRI dated 08/08/2014 FINDINGS: No evidence of fracture, dislocation, or joint effusion. No evidence of arthropathy or other focal bone abnormality. Soft tissues are unremarkable. IMPRESSION: Negative. Electronically Signed   By: Francene BoyersJames  Maxwell M.D.   On: 08/18/2017 15:18   Dg Knee Complete 4 Views Right  Result Date: 08/18/2017 CLINICAL DATA:  Chronic right knee pain. EXAM: RIGHT KNEE - COMPLETE 4+ VIEW  COMPARISON:  None. FINDINGS: No evidence of fracture, dislocation, or joint effusion. There is no joint space narrowing. There is a tiny marginal osteophyte on the lateral aspect of the patella. Soft tissues are unremarkable. IMPRESSION: No significant abnormalities. Electronically Signed   By: Francene BoyersJames  Maxwell M.D.   On: 08/18/2017 15:17      Assessment and Plan: 61 y.o. male with  Right knee pain: No significant degenerative changes present on x-ray which is surprising.  Based on the mechanical symptoms positive McMurray's test I am concerned for a medial meniscus injury.  He has failed conservative management consisting of greater than 6 weeks of physician directed home exercise program, and trial of medications.  Plan for MRI to evaluate the cause of pain and for potential surgical planning.  Recheck after MRI.  Left elbow pain: Lateral epicondylitis.  Plan for home exercise program consisting of eccentric exercises.  Recheck in a few weeks.    Orders Placed This Encounter  Procedures  . DG Knee Complete 4 Views Right    Please include patellar sunrise, lateral, and weightbearing bilateral AP and bilateral rosenberg views    Standing Status:   Future    Number of Occurrences:   1    Standing Expiration Date:   10/18/2018    Order Specific Question:   Reason for exam:    Answer:   Please include patellar sunrise, lateral, and weightbearing bilateral AP and bilateral rosenberg views    Comments:   Please include patellar sunrise, lateral, and weightbearing bilateral AP and bilateral rosenberg views    Order Specific Question:   Preferred imaging location?    Answer[facility]  . DG Knee 1-2 Views Left    Standing Status:   Future    Number of Occurrences:   1    Standing Expiration Date:   10/19/2018    Order Specific Question:   Reason for Exam (SYMPTOM  OR DIAGNOSIS REQUIRED)    Answer:   For use with right knee x-ray, bilateral AP and Rosenberg standing.    Order Specific  Question:   Preferred imaging location?    Answer:   [facility]  . MR Knee Right Wo Contrast    Standing Status:   Future    Standing Expiration Date:   10/17/2018    Order Specific Question:   What is the patient's sedation requirement?    Answer:   No Sedation    Order Specific Question:   Does the patient have a pacemaker or implanted devices?    Answer:   No    Order Specific Question:   Preferred imaging location?    Answer:   [facility]  (table limit-350lbs)    Order Specific Question:   Radiology Contrast Protocol - do NOT remove file path  Answer:   [email or web address]   No orders of the defined types were placed in this encounter.   Discussed warning signs or symptoms. Please see discharge instructions. Patient expresses understanding.

## 2017-08-18 NOTE — Patient Instructions (Signed)
Thank you for coming in today. Get MRI .  Follow up with me a few days after the MRI to go over results.  For elbow work on stretch and doing the negatives.  Go slowly.    Meniscus Tear A meniscus tear is a knee injury in which a piece of the meniscus is torn. The meniscus is a thick, rubbery, wedge-shaped cartilage in the knee. Two menisci are located in each knee. They sit between the upper bone (femur) and lower bone (tibia) that make up the knee joint. Each meniscus acts as a shock absorber for the knee. A torn meniscus is one of the most common types of knee injuries. This injury can range from mild to severe. Surgery may be needed for a severe tear. What are the causes? This injury may be caused by any squatting, twisting, or pivoting movement. Sports-related injuries are the most common cause. These often occur from:  Running and stopping suddenly.  Changing direction.  Being tackled or knocked off your feet.  As people get older, their meniscus gets thinner and weaker. In these people, tears can happen more easily, such as from climbing stairs. What increases the risk? This injury is more likely to happen to:  People who play contact sports.  Males.  People who are 8030?59 years of age.  What are the signs or symptoms? Symptoms of this injury include:  Knee pain, especially at the side of the knee joint. You may feel pain when the injury occurs, or you may only hear a pop and feel pain later.  A feeling that your knee is clicking, catching, locking, or giving way.  Not being able to fully bend or extend your knee.  Bruising or swelling in your knee.  How is this diagnosed? This injury may be diagnosed based on your symptoms and a physical exam. The physical exam may include:  Moving your knee in different ways.  Feeling for tenderness.  Listening for a clicking sound.  Checking if your knee locks or catches.  You may also have tests, such  as:  X-rays.  MRI.  A procedure to look inside your knee with a narrow surgical telescope (arthroscopy).  You may be referred to a knee specialist (orthopedic surgeon). How is this treated? Treatment for this injury depends on the severity of the tear. Treatment for a mild tear may include:  Rest.  Medicine to reduce pain and swelling. This is usually a nonsteroidal anti-inflammatory drug (NSAID).  A knee brace or an elastic sleeve or wrap.  Using crutches or a walker to keep weight off your knee and to help you walk.  Exercises to strengthen your knee (physical therapy).  You may need surgery if you have a severe tear or if other treatments are not working. Follow these instructions at home: Managing pain and swelling  Take over-the-counter and prescription medicines only as told by your health care provider.  If directed, apply ice to the injured area: ? Put ice in a plastic bag. ? Place a towel between your skin and the bag. ? Leave the ice on for 20 minutes, 2-3 times per day.  Raise (elevate) the injured area above the level of your heart while you are sitting or lying down. Activity  Do not use the injured limb to support your body weight until your health care provider says that you can. Use crutches or a walker as told by your health care provider.  Return to your normal activities as told  by your health care provider. Ask your health care provider what activities are safe for you.  Perform range-of-motion exercises only as told by your health care provider.  Begin doing exercises to strengthen your knee and leg muscles only as told by your health care provider. After you recover, your health care provider may recommend these exercises to help prevent another injury. General instructions  Use a knee brace or elastic wrap as told by your health care provider.  Keep all follow-up visits as told by your health care provider. This is important. Contact a health  care provider if:  You have a fever.  Your knee becomes red, tender, or swollen.  Your pain medicine is not helping.  Your symptoms get worse or do not improve after 2 weeks of home care. This information is not intended to replace advice given to you by your health care provider. Make sure you discuss any questions you have with your health care provider. Document Released: 10/26/2002 Document Revised: 01/11/2016 Document Reviewed: 11/28/2014 Elsevier Interactive Patient Education  2018 [employer]    Tennis Elbow Tennis elbow is puffiness (inflammation) of the outer tendons of your forearm close to your elbow. Your tendons attach your muscles to your bones. Tennis elbow can happen in any sport or job in which you use your elbow too much. It is caused by doing the same motion over and over. Tennis elbow can cause:  Pain and tenderness in your forearm and the outer part of your elbow.  A burning feeling. This runs from your elbow through your arm.  Weak grip in your hands.  Follow these instructions at home: Activity  Rest your elbow and wrist as told by your doctor. Try to avoid any activities that caused the problem until your doctor says that you can do them again.  If a physical therapist teaches you exercises, do all of them as told.  If you lift an object, lift it with your palm facing up. This is easier on your elbow. Lifestyle  If your tennis elbow is caused by sports, check your equipment and make sure that: ? You are using it correctly. ? It fits you well.  If your tennis elbow is caused by work, take breaks often, if you are able. Talk with your manager about doing your work in a way that is safe for you. ? If your tennis elbow is caused by computer use, talk with your manager about any changes that can be made to your work setup. General instructions  If told, apply ice to the painful area: ? Put ice in a plastic bag. ? Place a towel between your skin and  the bag. ? Leave the ice on for 20 minutes, 2-3 times per day.  Take medicines only as told by your doctor.  If you were given a brace, wear it as told by your doctor.  Keep all follow-up visits as told by your doctor. This is important. Contact a doctor if:  Your pain does not get better with treatment.  Your pain gets worse.  You have weakness in your forearm, hand, or fingers.  You cannot feel your forearm, hand, or fingers. This information is not intended to replace advice given to you by your health care provider. Make sure you discuss any questions you have with your health care provider. Document Released: 01/23/2010 Document Revised: 04/04/2016 Document Reviewed: 08/01/2014 Elsevier Interactive Patient Education  [employer]2018 [employer]

## 2017-09-08 ENCOUNTER — Telehealth: Payer: Self-pay | Admitting: Family Medicine

## 2017-09-15 ENCOUNTER — Ambulatory Visit (INDEPENDENT_AMBULATORY_CARE_PROVIDER_SITE_OTHER): Payer: BLUE CROSS/BLUE SHIELD

## 2017-09-15 DIAGNOSIS — R600 Localized edema: Secondary | ICD-10-CM | POA: Diagnosis not present

## 2017-09-15 DIAGNOSIS — M25561 Pain in right knee: Secondary | ICD-10-CM

## 2017-09-15 DIAGNOSIS — M25461 Effusion, right knee: Secondary | ICD-10-CM | POA: Diagnosis not present

## 2017-09-24 ENCOUNTER — Encounter: Payer: Self-pay | Admitting: Family Medicine

## 2017-09-24 ENCOUNTER — Ambulatory Visit (INDEPENDENT_AMBULATORY_CARE_PROVIDER_SITE_OTHER): Payer: BLUE CROSS/BLUE SHIELD | Admitting: Family Medicine

## 2017-09-24 VITALS — BP 151/94 | HR 94 | Ht 68.0 in | Wt 240.0 lb

## 2017-09-24 DIAGNOSIS — S72414A Nondisplaced unspecified condyle fracture of lower end of right femur, initial encounter for closed fracture: Secondary | ICD-10-CM

## 2017-09-24 DIAGNOSIS — M25561 Pain in right knee: Secondary | ICD-10-CM | POA: Diagnosis not present

## 2017-09-24 DIAGNOSIS — G8929 Other chronic pain: Secondary | ICD-10-CM | POA: Diagnosis not present

## 2017-09-24 NOTE — Patient Instructions (Addendum)
Thank you for coming in today. Avoid weight bearing for 4-6 weeks.  Recheck in 3 weeks.  Let pain be your guide.  Use the hinged knee brace when active.    Crutch Use, Adult Crutches are used to take weight off of one of your legs or feet when you stand or walk. You may need crutches to help heal after an injury or procedure. It is important to use crutches that fit right. Your crutches fit right if:  You can fit 2 or 3 fingers between your armpit and the crutch.  You use your hands, not your armpits, to hold yourself up.  Do not put your armpits on the crutches. This can damage the nerves in your shoulders, arms, back, armpits, and hands. It is important that a doctor has seen you use crutches the right way before you use them at home. How to use your crutches How you will use your crutches will depend on why you need them. Your doctor may tell you not to put weight on (not to support your weight with) your hurt leg (non-weight-bearing). Or, your doctor may let you put (bear) some of your weight on the hurt leg (partial weight-bearing), but not all of your weight. Follow instructions from your doctor about weight-bearing. Do not put weight on your leg in an amount that causes pain. Walking 1. Stand on your good leg and lift both crutches at the same time. 2. Place the crutches one step-length in front of you. 3. Bring the good leg forward to meet the crutches or to land a little bit ahead of them. 4. Repeat. Going up steps If there is no handrail: 1. Step up with your good leg. 2. Step up with the crutches and your hurt leg. 3. Repeat.  If there is a handrail: 1. Hold both crutches in one hand. 2. Place your other hand on the handrail. 3. Put your weight on your arms and lift your good leg up to the step. 4. Bring the crutches and the hurt leg up to that step. 5. Repeat.  Going up steps on your butt If you do not feel steady on steps, you can go up steps on your butt. 1. Sit on  the lowest step. ? Have your hurt leg out in front. ? Use your other hand to hold both crutches flat on the stairs. 2. Scoot your butt up to the next step. Use the free hand and your good leg to help you do this.  Going down steps If there is no handrail: 1. Step down with your hurt leg and crutches. 2. Step down with your good leg. 3. Repeat.  If there is a handrail: 1. Place your hand on the handrail. 2. Hold both crutches with your free hand. 3. Lower your hurt leg and crutch to the step below you. Keep the crutch tips in the center of the step. Never put the crutch tips on the edge of the step. 4. Lower your good leg to that step. 5. Repeat.  Going down steps on your butt If you do not feel steady on steps, you can go down steps on your butt. 1. Sit on the highest step. ? Have your hurt leg out in front. ? Use your other hand to hold both crutches flat on the stairs. 2. Scoot your butt down to the next step. Use the free hand and your good leg to help you do this.  Standing up 1. Hold the hurt leg  forward. 2. Grab the armrest with one hand. Use the other hand to grab the top of the crutches. 3. Use the armrest and your crutches to pull yourself up to stand. Sitting down 1. Hold the hurt leg forward. 2. Grab the armrest with one hand. Use the other hand to grab the top of the crutches. 3. Slowly lower yourself to sit. Get help if:  You feel unsteady or wobbly using crutches.  You have any new pain.  You cannot feel a part of your body (numbness) or you have a tingling feeling.  Your crutches do not fit. Get help right away if:  You fall. This information is not intended to replace advice given to you by your health care provider. Make sure you discuss any questions you have with your health care provider. Document Released: 01/22/2008 Document Revised: 03/08/2016 Document Reviewed: 01/26/2016 Elsevier Interactive Patient Education  [employer].

## 2017-09-25 DIAGNOSIS — S72414A Nondisplaced unspecified condyle fracture of lower end of right femur, initial encounter for closed fracture: Secondary | ICD-10-CM | POA: Insufficient documentation

## 2017-09-25 NOTE — Progress Notes (Signed)
Craig Mccormick is a 62 y.o. male who presents to [facility] today for right knee pain. Has had right knee pain for 6 months now. I saw him a little over a month ago and was suspicious for meniscus injury. An MRI was obtained which was performed last week showing a lateral femoral condyle fracture.  He was advised to start immobilization and nonweightbearing and is here today for follow-up. He notes that he is not been using immobilization but with limited her nonweightbearing he is feeling a lot better with much less pain and swelling.   Past Medical History:  Diagnosis Date  . Elevated BP   . Folliculitis   . Hyperlipemia   . Hypertension   . Squamous carcinoma   . Tachycardia    Past Surgical History:  Procedure Laterality Date  . CORONARY ANGIOPLASTY WITH STENT PLACEMENT    . SKIN CANCER DESTRUCTION    . SKIN CANCER EXCISION     Social History   Tobacco Use  . Smoking status: Current Every Day Smoker    Packs/day: 0.50    Types: Cigarettes  . Smokeless tobacco: Never Used  . Tobacco comment: pt attempting to d/c; form given 07-30-13  Substance Use Topics  . Alcohol use: Yes    Alcohol/week: 0.0 oz    Comment: 1 drink daily     ROS:  As above   Medications: Current Outpatient Medications  Medication Sig Dispense Refill  . aspirin 81 MG tablet Take 81 mg by mouth daily.    . clopidogrel (PLAVIX) 75 MG tablet Take 75 mg by mouth daily.    . fish oil-omega-3 fatty acids 1000 MG capsule Take 2 g by mouth daily.    . Melatonin 3 MG TABS Take 5 tablets by mouth at bedtime as needed.     . metoprolol (LOPRESSOR) 50 MG tablet Take 50 mg by mouth 2 (two) times daily.  30 tablet 11  . Multiple Vitamin (MULTIVITAMIN) tablet Take 1 tablet by mouth daily.    . rosuvastatin (CRESTOR) 40 MG tablet Take 1 tablet (40 mg total) by mouth daily. 90 tablet 3   No current facility-administered medications for this visit.    Allergies    Allergen Reactions  . Doxycycline     Vomiting     Exam:  BP (!) 151/94   Pulse 94   Ht 5\' 8"  (1.727 m)   Wt 240 lb (108.9 kg)   BMI 36.49 kg/m  General: Well Developed, well nourished, and in no acute distress.  Neuro/Psych: Alert and oriented x3, extra-ocular muscles intact, able to move all 4 extremities, sensation grossly intact. Skin: Warm and dry, no rashes noted.  Respiratory: Not using accessory muscles, speaking in full sentences, trachea midline.  Cardiovascular: Pulses palpable, no extremity edema. Abdomen: Does not appear distended. MSK: right knee no swelling mildly tender to palpation lateral joint line normal motion.  CLINICAL DATA:  Injured working outside 6 months ago.  EXAM: MRI OF THE RIGHT KNEE WITHOUT CONTRAST  TECHNIQUE: Multiplanar, multisequence MR imaging of the knee was performed. No intravenous contrast was administered.  COMPARISON:  None.  FINDINGS: MENISCI  Medial meniscus:  Intact.  Lateral meniscus:  Intact.  LIGAMENTS  Cruciates:  Intact ACL and PCL.  Collaterals: Medial collateral ligament is intact. Lateral collateral ligament complex is intact.  CARTILAGE  Patellofemoral: Small cartilage fissure of the lateral patellar facet.  Medial: Partial-thickness cartilage loss of the medial femorotibial compartment.  Lateral:  No chondral defect.  Joint: No joint effusion. Normal Hoffa's fat. Edema in the deep suprapatellar fat pad. No plical thickening.  Popliteal Fossa:  No Baker cyst. Intact popliteus tendon.  Extensor Mechanism: Intact quadriceps tendon. Intact patellar tendon. Intact medial patellar retinaculum. Intact lateral patellar retinaculum. Intact MPFL.  Bones: Marrow edema in the anterior lateral femoral condyle with a focal area of cortical disruption along the cental anterior distal femoral metaphysis concerning for a nondisplaced fracture. No fracture or dislocation.  Other: Muscles  are normal. Multiloculated ganglion cyst along the posterior joint capsule.  IMPRESSION: 1. Marrow edema in the anterior lateral femoral condyle with a focal area of cortical disruption along the cental anterior distal femoral metaphysis concerning for a nondisplaced fracture. Edema in the adjacent deep suprapatellar fat pad.   Electronically Signed   By: Elige KoHetal  Patel   On: 09/15/2017 11:56    Assessment and Plan: 62 y.o. male with lateral femoral condyle fracture. I think the best treatment would be completely nonweightbearing with a hinged knee brace.  Craig NeedleMichael agrees to try complete nonweightbearing but is back up plan is touchdown weightbearing with a hinged knee brace.  Will recheck  In 2-3 weeks. Plan for total nonweightbearing time approximately 4-6 weeks. Expect good recovery as he is already improving after about a week of limited weightbearing.    No orders of the defined types were placed in this encounter.  No orders of the defined types were placed in this encounter.   Discussed warning signs or symptoms. Please see discharge instructions. Patient expresses understanding.  I spent 25 minutes with this patient, greater than 50% was face-to-face time counseling regarding ddx and treatment plan and weightbearing plan.

## 2017-09-27 DIAGNOSIS — I10 Essential (primary) hypertension: Secondary | ICD-10-CM | POA: Diagnosis not present

## 2017-09-27 DIAGNOSIS — J209 Acute bronchitis, unspecified: Secondary | ICD-10-CM | POA: Diagnosis not present

## 2017-10-17 ENCOUNTER — Ambulatory Visit: Payer: BLUE CROSS/BLUE SHIELD | Admitting: Family Medicine

## 2017-10-17 ENCOUNTER — Encounter: Payer: Self-pay | Admitting: Family Medicine

## 2017-10-17 VITALS — BP 150/92 | HR 84 | Ht 68.0 in | Wt 241.0 lb

## 2017-10-17 DIAGNOSIS — I2510 Atherosclerotic heart disease of native coronary artery without angina pectoris: Secondary | ICD-10-CM | POA: Diagnosis not present

## 2017-10-17 DIAGNOSIS — Z9861 Coronary angioplasty status: Secondary | ICD-10-CM | POA: Diagnosis not present

## 2017-10-17 DIAGNOSIS — Z1211 Encounter for screening for malignant neoplasm of colon: Secondary | ICD-10-CM | POA: Diagnosis not present

## 2017-10-17 DIAGNOSIS — S72425A Nondisplaced fracture of lateral condyle of left femur, initial encounter for closed fracture: Secondary | ICD-10-CM | POA: Diagnosis not present

## 2017-10-17 DIAGNOSIS — I10 Essential (primary) hypertension: Secondary | ICD-10-CM | POA: Diagnosis not present

## 2017-10-17 NOTE — Patient Instructions (Signed)
Thank you for coming in today. Continue crutches.  It is ok to gently start increasing your weightbearing guided by pain.  The next it to ditch the right crutch.  You should hear from [facility] about colon cancer screening.  I think its ok to stop aspirin and plavix the week before the colonoscopy but double check with cardiology.   Recheck with me in 3 weeks.  Return sooner if needed.

## 2017-10-17 NOTE — Progress Notes (Signed)
Craig Mccormick is a 62 y.o. male who presents to [facility] Craig Mccormick: [facility] today for follow-up right femur fracture.  Craig NeedleMichael had subacute to chronic pain in the right knee for a few months.  X-rays were unremarkable.  I obtained an MRI and discovered a nondisplaced right femoral condyle fracture.  We switched to limited weightbearing with hinged knee brace.  In the interim he notes considerable improvement in pain.  He does about 20% weightbearing with his right leg with 2 crutches.  At times he will use one crutch and notes that his pain is pretty well controlled.  He feels dramatically better now than he did a month ago.   Past Medical History:  Diagnosis Date  . Elevated BP   . Folliculitis   . Hyperlipemia   . Hypertension   . Squamous carcinoma   . Tachycardia    Past Surgical History:  Procedure Laterality Date  . CORONARY ANGIOPLASTY WITH STENT PLACEMENT    . SKIN CANCER DESTRUCTION    . SKIN CANCER EXCISION     Social History   Tobacco Use  . Smoking status: Current Every Day Smoker    Packs/day: 0.50    Types: Cigarettes  . Smokeless tobacco: Never Used  . Tobacco comment: pt attempting to d/c; form given 07-30-13  Substance Use Topics  . Alcohol use: Yes    Alcohol/week: 0.0 oz    Comment: 1 drink daily   family history includes Diabetes in his father; Heart disease in his father; Hyperlipidemia in his brother; Lung cancer in his mother; Prostate cancer in his father.  ROS as above:  Medications: Current Outpatient Medications  Medication Sig Dispense Refill  . aspirin 81 MG tablet Take 81 mg by mouth daily.    . clopidogrel (PLAVIX) 75 MG tablet Take 75 mg by mouth daily.    . fish oil-omega-3 fatty acids 1000 MG capsule Take 2 g by mouth daily.    . Melatonin 3 MG TABS Take 5 tablets by mouth at bedtime as needed.     . metoprolol (LOPRESSOR) 50 MG  tablet Take 50 mg by mouth 2 (two) times daily.  30 tablet 11  . Multiple Vitamin (MULTIVITAMIN) tablet Take 1 tablet by mouth daily.    . rosuvastatin (CRESTOR) 40 MG tablet Take 1 tablet (40 mg total) by mouth daily. 90 tablet 3   No current facility-administered medications for this visit.    Allergies  Allergen Reactions  . Doxycycline     Vomiting    Health Maintenance Health Maintenance  Topic Date Due  . Hepatitis C Screening  Feb 28, 1956  . HIV Screening  12/29/1972  . COLONOSCOPY  12/30/2007  . TETANUS/TDAP  10/13/2025  . INFLUENZA VACCINE  Completed     Exam:  BP (!) 150/92   Pulse 84   Ht 5\' 8"  (1.727 m)   Wt 241 lb (109.3 kg)   BMI 36.64 kg/m  Gen: Well NAD HEENT: EOMI,  MMM Lungs: Normal work of breathing. CTABL Heart: RRR no MRG Abd: NABS, Soft. Nondistended, Nontender Exts: Brisk capillary refill, warm and well perfused.  MSK: Right knee tender palpation lateral joint line.  Range of motion intact  MRI right knee September 15, 2017 showing nondisplaced femoral condyle fracture reviewed.  Images independently reviewed    Assessment and Plan: 62 y.o. male with  Right femoral condyle fracture: Clinically significantly improved.  Will continue limited weightbearing and advance weightbearing as tolerated  slowly over the next few weeks.  Recheck in 3 weeks.  Hypertension: Blood pressure not controlled today.  We discussed options.  Patient would like to complete at home blood pressure log and send results back to myself or his primary care provider in my practice.  Colon cancer screening: We discussed colon cancer screening which he is overdue for.  He elects for colonoscopy.  Referral sent to digestive health specialist.  CAD: Additionally patient has a history of a drug-eluting stent and is taking aspirin and Plavix.  The stent was placed in 2014.  I advised him that his gastroenterologist would very likely want him off of aspirin and Plavix 1 week prior to  the planned colonoscopy.  I think this is very likely to be safe but I recommend he double check with his cardiologist.  Orders Placed This Encounter  Procedures  . Ambulatory referral to Gastroenterology    Referral Priority:   Routine    Referral Type:   Consultation    Referral Reason:   Specialty Services Required    Number of Visits Requested:   1   No orders of the defined types were placed in this encounter.    Discussed warning signs or symptoms. Please see discharge instructions. Patient expresses understanding.

## 2017-11-07 ENCOUNTER — Ambulatory Visit: Payer: BLUE CROSS/BLUE SHIELD | Admitting: Family Medicine

## 2017-11-20 ENCOUNTER — Ambulatory Visit (INDEPENDENT_AMBULATORY_CARE_PROVIDER_SITE_OTHER): Payer: BLUE CROSS/BLUE SHIELD | Admitting: Family Medicine

## 2017-11-20 ENCOUNTER — Encounter: Payer: Self-pay | Admitting: Family Medicine

## 2017-11-20 VITALS — BP 132/85 | HR 76 | Ht 68.0 in | Wt 239.0 lb

## 2017-11-20 DIAGNOSIS — S8264XA Nondisplaced fracture of lateral malleolus of right fibula, initial encounter for closed fracture: Secondary | ICD-10-CM

## 2017-11-20 DIAGNOSIS — I2510 Atherosclerotic heart disease of native coronary artery without angina pectoris: Secondary | ICD-10-CM | POA: Diagnosis not present

## 2017-11-20 DIAGNOSIS — I10 Essential (primary) hypertension: Secondary | ICD-10-CM | POA: Diagnosis not present

## 2017-11-20 MED ORDER — LISINOPRIL 10 MG PO TABS: 10.0000 mg | ORAL_TABLET | Freq: Every day | ORAL | 1 refills | Status: DC

## 2017-11-20 NOTE — Patient Instructions (Addendum)
Thank you for coming in today. Work on Horticulturist, commercialquad strength exercises on your own.  Let me know if we need to do formal Physical therapy.  Check back in 1 month.  Start lisinopril after colonoscopy.

## 2017-11-20 NOTE — Progress Notes (Signed)
Craig Mccormick is a 62 y.o. male who [facility] today for follow-up right knee lateral femoral condyle fracture, discuss hypertension, and colon cancer screening.  I seen Craig Mccormick notes several times for management of his right femoral condyle fracture.  This occurred several months ago and was seen only on MRI after negative radiographs.  He was managed with nonweightbearing and subsequently bracing.  At this point he is able to walk normally without a brace.  He notes some only mild soreness and is increasing his activity and quadricep strengthening.  He feels well.  Hypertension: Craig Mccormick has hypertension managed with metoprolol.  He denies chest pain palpitations or shortness of breath.  In the past he tolerated lisinopril but it was discontinued when his blood pressure was in the normal range by his previous primary care provider.  Colon cancer screening: Referred to gastroenterology last visit.  Colonoscopy scheduled for tomorrow.   Past Medical History:  Diagnosis Date  . Elevated BP   . Folliculitis   . Hyperlipemia   . Hypertension   . Squamous carcinoma   . Tachycardia    Past Surgical History:  Procedure Laterality Date  . CORONARY ANGIOPLASTY WITH STENT PLACEMENT    . SKIN CANCER DESTRUCTION    . SKIN CANCER EXCISION     Social History   Tobacco Use  . Smoking status: Current Every Day Smoker    Packs/day: 0.50    Types: Cigarettes  . Smokeless tobacco: Never Used  . Tobacco comment: pt attempting to d/c; form given 07-30-13  Substance Use Topics  . Alcohol use: Yes    Alcohol/week: 0.0 oz    Comment: 1 drink daily   family history includes Diabetes in his father; Heart disease in his father; Hyperlipidemia in his brother; Lung cancer in his mother; Prostate cancer in his father.  ROS as above:  Medications: Current Outpatient  Medications  Medication Sig Dispense Refill  . aspirin 81 MG tablet Take 81 mg by mouth daily.    . clopidogrel (PLAVIX) 75 MG tablet Take 75 mg by mouth daily.    . fish oil-omega-3 fatty acids 1000 MG capsule Take 2 g by mouth daily.    . Melatonin 3 MG TABS Take 5 tablets by mouth at bedtime as needed.     . metoprolol (LOPRESSOR) 50 MG tablet Take 50 mg by mouth 2 (two) times daily.  30 tablet 11  . Multiple Vitamin (MULTIVITAMIN) tablet Take 1 tablet by mouth daily.    . rosuvastatin (CRESTOR) 40 MG tablet Take 1 tablet (40 mg total) by mouth daily. 90 tablet 3   No current facility-administered medications for this visit.    Allergies  Allergen Reactions  . Doxycycline     Vomiting    Health Maintenance Health Maintenance  Topic Date Due  . Hepatitis C Screening  10/18/1955  . HIV Screening  12/29/1972  . COLONOSCOPY  12/30/2007  . INFLUENZA VACCINE  03/19/2018  . TETANUS/TDAP  10/13/2025     Exam:  BP 132/85   Pulse 76   Ht 5\' 8"  (1.727 m)   Wt 239 lb (108.4 kg)   BMI 36.34 kg/m  Gen: Well NAD HEENT: EOMI,  MMM Lungs: Normal work of breathing. CTABL Heart: RRR no MRG Abd: NABS, Soft. Nondistended, Nontender Exts: Brisk capillary refill, warm and well perfused.  Right knee well-appearing nontender normal motion.  Intact strength.    No results found  for this or any previous visit (from the past 72 hour(s)). No results found.    Assessment and Plan: 62 y.o. male with  Femoral condyle fracture doing well.  Advanced quadricep strengthening exercises.  Offered physical therapy patient declined.  Recheck in 1 month.  Hypertension: Not at goal.  Patient additionally has coronary artery disease and aortic sclerosis.  This raises the stakes from his CVD standpoint.  Plan to continue with Toprol but add lisinopril and recheck in 1 month.  We will check labs at that time.  Colon cancer screening colonoscopy pending. Health maintenance: We will obtain HIV and  hepatitis C screening with next labs.   No orders of the defined types were placed in this encounter.  No orders of the defined types were placed in this encounter.    Discussed warning signs or symptoms. Please see discharge instructions. Patient expresses understanding.

## 2017-11-21 DIAGNOSIS — D124 Benign neoplasm of descending colon: Secondary | ICD-10-CM | POA: Diagnosis not present

## 2017-11-21 DIAGNOSIS — D123 Benign neoplasm of transverse colon: Secondary | ICD-10-CM | POA: Diagnosis not present

## 2017-11-21 DIAGNOSIS — Z1211 Encounter for screening for malignant neoplasm of colon: Secondary | ICD-10-CM | POA: Diagnosis not present

## 2017-11-21 LAB — HM COLONOSCOPY

## 2017-12-02 ENCOUNTER — Encounter: Payer: Self-pay | Admitting: Osteopathic Medicine

## 2017-12-19 ENCOUNTER — Ambulatory Visit: Payer: BLUE CROSS/BLUE SHIELD

## 2018-05-28 ENCOUNTER — Other Ambulatory Visit: Payer: Self-pay | Admitting: Family Medicine

## 2018-07-28 ENCOUNTER — Other Ambulatory Visit: Payer: Self-pay

## 2018-07-28 MED ORDER — LISINOPRIL 10 MG PO TABS: 10.0000 mg | ORAL_TABLET | Freq: Every day | ORAL | 1 refills | Status: DC

## 2018-08-18 ENCOUNTER — Encounter: Payer: Self-pay | Admitting: Family Medicine

## 2018-08-18 ENCOUNTER — Ambulatory Visit (INDEPENDENT_AMBULATORY_CARE_PROVIDER_SITE_OTHER): Payer: 59 | Admitting: Family Medicine

## 2018-08-18 VITALS — BP 161/96 | HR 75 | Wt 242.0 lb

## 2018-08-18 DIAGNOSIS — R05 Cough: Secondary | ICD-10-CM | POA: Diagnosis not present

## 2018-08-18 DIAGNOSIS — R059 Cough, unspecified: Secondary | ICD-10-CM

## 2018-08-18 MED ORDER — CEFDINIR 300 MG PO CAPS: 300.0000 mg | ORAL_CAPSULE | Freq: Two times a day (BID) | ORAL | 0 refills | Status: DC

## 2018-08-18 MED ORDER — BENZONATATE 200 MG PO CAPS: 200.0000 mg | ORAL_CAPSULE | Freq: Three times a day (TID) | ORAL | 0 refills | Status: DC | PRN

## 2018-08-18 MED ORDER — HYDROCODONE-HOMATROPINE 5-1.5 MG/5ML PO SYRP: 5.0000 mL | ORAL_SOLUTION | Freq: Three times a day (TID) | ORAL | 0 refills | Status: DC | PRN

## 2018-08-18 MED ORDER — OMEPRAZOLE 40 MG PO CPDR: 40.0000 mg | DELAYED_RELEASE_CAPSULE | Freq: Every day | ORAL | 3 refills | Status: DC

## 2018-08-18 NOTE — Progress Notes (Signed)
Craig Mccormick is a 62 y.o. male who [facility] today for cough congestion sinus pain and pressure.  Symptoms present for over a week.  Patient was seen in the emergency department on December 25 he was diagnosed with bronchitis.  Chest x-ray was unremarkable.  He was prescribed prednisone, azithromycin, Hycodan cough syrup and albuterol.  He notes the Hycodan helped but he is run out.  He finished the prednisone and azithromycin course as well.  He notes continued cough congestion sinus pain or pressure.  Cough is bothersome mostly at bedtime when he lays down.  He denies any orthopnea or leg swelling.   ROS as above:  Exam:  BP (!) 161/96   Pulse 75   Wt 242 lb (109.8 kg)   SpO2 95%   BMI 36.80 kg/m  Wt Readings from Last 5 Encounters:  08/18/18 242 lb (109.8 kg)  11/20/17 239 lb (108.4 kg)  10/17/17 241 lb (109.3 kg)  09/24/17 240 lb (108.9 kg)  08/18/17 241 lb (109.3 kg)    Gen: Well NAD HEENT: EOMI,  MMM clear nasal discharge.  Posterior pharynx with cobblestoning.  Normal tympanic membranes.  No cervical lymphadenopathy.  Mildly tender to palpation maxillary sinuses. Lungs: Normal work of breathing. CTABL Heart: RRR no MRG Abd: NABS, Soft. Nondistended, Nontender Exts: Brisk capillary refill, warm and well perfused.   Lab and Radiology Results ED note and chest x-ray report reviewed from [facility]   Assessment and Plan: 62 y.o. male with cough and congestion likely resolving bronchitis.  Patient just completed course of azithromycin and steroids.  Plan to treat symptomatically with Tessalon Perles and Hycodan cough syrup.  We will additionally try omeprazole as patient may have some silent acid reflux.  Printed backup prescription of Omnicef to take if symptoms worsen or do not improve.  Recheck as needed.   No orders of the defined types were  placed in this encounter.  Meds ordered this encounter  Medications  . cefdinir (OMNICEF) 300 MG capsule    Sig: Take 1 capsule (300 mg total) by mouth 2 (two) times daily.    Dispense:  14 capsule    Refill:  0  . HYDROcodone-homatropine (HYCODAN) 5-1.5 MG/5ML syrup    Sig: Take 5 mLs by mouth every 8 (eight) hours as needed for cough.    Dispense:  120 mL    Refill:  0  . benzonatate (TESSALON) 200 MG capsule    Sig: Take 1 capsule (200 mg total) by mouth 3 (three) times daily as needed for cough.    Dispense:  45 capsule    Refill:  0  . omeprazole (PRILOSEC) 40 MG capsule    Sig: Take 1 capsule (40 mg total) by mouth daily.    Dispense:  30 capsule    Refill:  3     Historical information moved to improve visibility of documentation.  Past Medical History:  Diagnosis Date  . Elevated BP   . Folliculitis   . Hyperlipemia   . Hypertension   . Squamous carcinoma   . Tachycardia    Past Surgical History:  Procedure Laterality Date  . CORONARY ANGIOPLASTY WITH STENT PLACEMENT    . SKIN CANCER DESTRUCTION    . SKIN CANCER EXCISION     Social History   Tobacco Use  . Smoking status: Current Every Day Smoker    Packs/day: 0.50    Types: Cigarettes  .  Smokeless tobacco: Never Used  . Tobacco comment: pt attempting to d/c; form given 07-30-13  Substance Use Topics  . Alcohol use: Yes    Comment: 1 drink daily   family history includes Diabetes in his father; Heart disease in his father; Hyperlipidemia in his brother; Lung cancer in his mother; Prostate cancer in his father.  Medications: Current Outpatient Medications  Medication Sig Dispense Refill  . aspirin 81 MG tablet Take 81 mg by mouth daily.    . clopidogrel (PLAVIX) 75 MG tablet Take 75 mg by mouth daily.    . fish oil-omega-3 fatty acids 1000 MG capsule Take 2 g by mouth daily.    Marland Kitchen. lisinopril (PRINIVIL,ZESTRIL) 10 MG tablet Take 1 tablet (10 mg total) by mouth daily. No refills. Pt needs f/u appt w/PCP.  30 tablet 1  . Melatonin 3 MG TABS Take 5 tablets by mouth at bedtime as needed.     . metoprolol (LOPRESSOR) 50 MG tablet Take 50 mg by mouth 2 (two) times daily.  30 tablet 11  . Multiple Vitamin (MULTIVITAMIN) tablet Take 1 tablet by mouth daily.    . rosuvastatin (CRESTOR) 40 MG tablet Take 1 tablet (40 mg total) by mouth daily. 90 tablet 3  . benzonatate (TESSALON) 200 MG capsule Take 1 capsule (200 mg total) by mouth 3 (three) times daily as needed for cough. 45 capsule 0  . cefdinir (OMNICEF) 300 MG capsule Take 1 capsule (300 mg total) by mouth 2 (two) times daily. 14 capsule 0  . HYDROcodone-homatropine (HYCODAN) 5-1.5 MG/5ML syrup Take 5 mLs by mouth every 8 (eight) hours as needed for cough. 120 mL 0  . omeprazole (PRILOSEC) 40 MG capsule Take 1 capsule (40 mg total) by mouth daily. 30 capsule 3   No current facility-administered medications for this visit.    Allergies  Allergen Reactions  . Doxycycline     Vomiting     Discussed warning signs or symptoms. Please see discharge instructions. Patient expresses understanding.

## 2018-08-18 NOTE — Patient Instructions (Signed)
Thank you for coming in today. Use the cough medicines as needed.  Continue albuterol.  Take the omeprazole for possible silent acid reflux causing a cough.   If worse fill and take backup antibiotic.  Call or go to the emergency room if you get worse, have trouble breathing, have chest pains, or palpitations.   Follow up with Dr Calton GoldsAlexander soonish.

## 2018-10-14 ENCOUNTER — Other Ambulatory Visit: Payer: Self-pay | Admitting: Osteopathic Medicine

## 2018-10-14 NOTE — Telephone Encounter (Signed)
Please review for refill- patient at [facility] 

## 2018-11-16 ENCOUNTER — Other Ambulatory Visit: Payer: Self-pay | Admitting: Osteopathic Medicine

## 2018-11-19 ENCOUNTER — Telehealth: Payer: Self-pay | Admitting: Osteopathic Medicine

## 2018-11-19 NOTE — Telephone Encounter (Signed)
Received fax from [facility] that Omeprazole was approved from 11/18/2018 through 08/18/2038. Pharmacy notified and forms sent to scan.

## 2018-12-01 ENCOUNTER — Other Ambulatory Visit: Payer: Self-pay | Admitting: Osteopathic Medicine

## 2018-12-03 ENCOUNTER — Telehealth: Payer: Self-pay

## 2018-12-03 DIAGNOSIS — I2510 Atherosclerotic heart disease of native coronary artery without angina pectoris: Secondary | ICD-10-CM

## 2018-12-03 MED ORDER — METOPROLOL TARTRATE 50 MG PO TABS: 50.0000 mg | ORAL_TABLET | Freq: Two times a day (BID) | ORAL | 0 refills | Status: DC

## 2018-12-03 NOTE — Telephone Encounter (Signed)
I haven't seen him since 03/2017 30 days sent but he needs follow-up Virtual visit is fine

## 2018-12-03 NOTE — Telephone Encounter (Signed)
[facility] requesting med refill for metoprolol tart 50 mg/bid. Written by historical provider.

## 2018-12-03 NOTE — Telephone Encounter (Signed)
Pt has been updated of med refill request from pharmacy. Aware of provide's note. Agrees with plan, transferred to scheduling desk to make virtual visit with provider. No other inquiries during call.

## 2018-12-04 ENCOUNTER — Ambulatory Visit (INDEPENDENT_AMBULATORY_CARE_PROVIDER_SITE_OTHER): Payer: Self-pay | Admitting: Osteopathic Medicine

## 2018-12-04 ENCOUNTER — Encounter: Payer: Self-pay | Admitting: Osteopathic Medicine

## 2018-12-04 VITALS — BP 144/86 | Temp 97.3°F | Wt 232.0 lb

## 2018-12-04 DIAGNOSIS — I2510 Atherosclerotic heart disease of native coronary artery without angina pectoris: Secondary | ICD-10-CM

## 2018-12-04 DIAGNOSIS — Z125 Encounter for screening for malignant neoplasm of prostate: Secondary | ICD-10-CM

## 2018-12-04 DIAGNOSIS — E782 Mixed hyperlipidemia: Secondary | ICD-10-CM

## 2018-12-04 DIAGNOSIS — I10 Essential (primary) hypertension: Secondary | ICD-10-CM

## 2018-12-04 MED ORDER — CLOPIDOGREL BISULFATE 75 MG PO TABS: 75.0000 mg | ORAL_TABLET | Freq: Every day | ORAL | 3 refills | Status: DC

## 2018-12-04 MED ORDER — METOPROLOL TARTRATE 50 MG PO TABS: 50.0000 mg | ORAL_TABLET | Freq: Two times a day (BID) | ORAL | 3 refills | Status: DC

## 2018-12-04 MED ORDER — LISINOPRIL 10 MG PO TABS: 10.0000 mg | ORAL_TABLET | Freq: Every day | ORAL | 3 refills | Status: DC

## 2018-12-04 MED ORDER — ROSUVASTATIN CALCIUM 40 MG PO TABS: 40.0000 mg | ORAL_TABLET | Freq: Every day | ORAL | 3 refills | Status: DC

## 2018-12-04 NOTE — Progress Notes (Signed)
Virtual Visit  via Video or Phone Note  I connected with      Craig Mccormick on 12/04/18 at 9:30 by a telemedicine application and verified that I am speaking with the correct person using two identifiers.   I discussed the limitations of evaluation and management by telemedicine and the availability of in person appointments. The patient expressed understanding and agreed to proceed.  History of Present Illness: Craig Mccormick is a 63 y.o. male who would like to discuss medication refills   I hadn't seen this patient since 2018, he has seen one of my sports med colleagues several times for fracture management. He's been doing well, needs refills on some meds that cardiology was managing / that Dr Denyse Amassorey started.   Reports home BP have been ok, typically 130s occasionally 140s systolic, diastolic typically 16X80s or 70s no CP/SOB, no HA/VC/dizzy.   Seen by cardiology 07/2018, Hx CAD s/p stent to LAD 2014, HTN, PSVT, aortic atherosclerosis, HLD. EKG WNL at that visit but pt c/o unusual gagging symptoms when leaning over. Nurse Prac at that time recommended stress echo, I can't see results (Care Everywhere).    Observations/Objective: BP (!) 144/86 (BP Location: Left Arm, Patient Position: Sitting, Cuff Size: Normal)   Temp (!) 97.3 F (36.3 C) (Oral)   Wt 232 lb (105.2 kg)   BMI 35.28 kg/m  BP Readings from Last 3 Encounters:  12/04/18 (!) 144/86  08/18/18 (!) 161/96  11/20/17 132/85   Exam: Normal Speech.  Appears well, NAD  Lab and Radiology Results No results found for this or any previous visit (from the past 72 hour(s)). No results found.     Assessment and Plan: 63 y.o. male with The primary encounter diagnosis was Essential hypertension. Diagnoses of CAD (coronary artery disease), Mixed hyperlipidemia, and Prostate cancer screening were also pertinent to this visit.   PDMP not reviewed this encounter. Orders Placed This Encounter  Procedures  . CBC  . COMPLETE  METABOLIC PANEL WITH GFR  . Lipid panel  . PSA, Total with Reflex to PSA, Free   Meds ordered this encounter  Medications  . lisinopril (ZESTRIL) 10 MG tablet    Sig: Take 1 tablet (10 mg total) by mouth daily.    Dispense:  90 tablet    Refill:  3  . metoprolol tartrate (LOPRESSOR) 50 MG tablet    Sig: Take 1 tablet (50 mg total) by mouth 2 (two) times daily.    Dispense:  90 tablet    Refill:  3  . rosuvastatin (CRESTOR) 40 MG tablet    Sig: Take 1 tablet (40 mg total) by mouth daily.    Dispense:  90 tablet    Refill:  3  . clopidogrel (PLAVIX) 75 MG tablet    Sig: Take 1 tablet (75 mg total) by mouth daily.    Dispense:  90 tablet    Refill:  3   Instructions sent via MyChart. If MyChart not available, pt was given option for info via personal e-mail w/ no guarantee of protected health info over unsecured e-mail communication, and MyChart sign-up instructions were included.   Follow Up Instructions: Return in about 6 months (around 06/05/2019) for annual check up w/ labs - sooner if needed .    I discussed the assessment and treatment plan with the patient. The patient was provided an opportunity to ask questions and all were answered. The patient agreed with the plan and demonstrated an understanding of the instructions.  The patient was advised to call back or seek an in-person evaluation if the symptoms worsen or if the condition fails to improve as anticipated.  I provided 25 minutes of non-face-to-face time during this encounter.                      Historical information moved to improve visibility of documentation.  Past Medical History:  Diagnosis Date  . Elevated BP   . Folliculitis   . Hyperlipemia   . Hypertension   . Squamous carcinoma   . Tachycardia    Past Surgical History:  Procedure Laterality Date  . CORONARY ANGIOPLASTY WITH STENT PLACEMENT    . SKIN CANCER DESTRUCTION    . SKIN CANCER EXCISION     Social History    Tobacco Use  . Smoking status: Current Every Day Smoker    Packs/day: 0.50    Types: Cigarettes  . Smokeless tobacco: Never Used  . Tobacco comment: pt attempting to d/c; form given 07-30-13  Substance Use Topics  . Alcohol use: Yes    Comment: 1 drink daily   family history includes Diabetes in his father; Heart disease in his father; Hyperlipidemia in his brother; Lung cancer in his mother; Prostate cancer in his father.  Medications: Current Outpatient Medications  Medication Sig Dispense Refill  . aspirin 81 MG tablet Take 81 mg by mouth daily.    . benzonatate (TESSALON) 200 MG capsule Take 1 capsule (200 mg total) by mouth 3 (three) times daily as needed for cough. 45 capsule 0  . cefdinir (OMNICEF) 300 MG capsule Take 1 capsule (300 mg total) by mouth 2 (two) times daily. 14 capsule 0  . clopidogrel (PLAVIX) 75 MG tablet Take 75 mg by mouth daily.    . fish oil-omega-3 fatty acids 1000 MG capsule Take 2 g by mouth daily.    Marland Kitchen HYDROcodone-homatropine (HYCODAN) 5-1.5 MG/5ML syrup Take 5 mLs by mouth every 8 (eight) hours as needed for cough. 120 mL 0  . lisinopril (PRINIVIL,ZESTRIL) 10 MG tablet Take 1 tablet (10 mg total) by mouth daily. NO MORE REFILLS. Overdue for f/u w/PCP. 7 tablet 0  . Melatonin 3 MG TABS Take 5 tablets by mouth at bedtime as needed.     . metoprolol tartrate (LOPRESSOR) 50 MG tablet Take 1 tablet (50 mg total) by mouth 2 (two) times daily. 30 tablet 0  . Multiple Vitamin (MULTIVITAMIN) tablet Take 1 tablet by mouth daily.    Marland Kitchen omeprazole (PRILOSEC) 40 MG capsule Take 1 capsule (40 mg total) by mouth daily. 30 capsule 3  . rosuvastatin (CRESTOR) 40 MG tablet Take 1 tablet (40 mg total) by mouth daily. 90 tablet 3   No current facility-administered medications for this visit.    Allergies  Allergen Reactions  . Doxycycline     Vomiting    PDMP not reviewed this encounter. No orders of the defined types were placed in this encounter.  No orders of  the defined types were placed in this encounter.

## 2019-05-24 ENCOUNTER — Encounter: Payer: Self-pay | Admitting: Osteopathic Medicine

## 2019-07-19 ENCOUNTER — Other Ambulatory Visit: Payer: Self-pay | Admitting: Osteopathic Medicine

## 2019-11-24 ENCOUNTER — Telehealth: Payer: Self-pay | Admitting: Osteopathic Medicine

## 2019-11-24 ENCOUNTER — Other Ambulatory Visit: Payer: Self-pay | Admitting: Osteopathic Medicine

## 2019-11-24 NOTE — Telephone Encounter (Signed)
LVM twice to schedule for 30 day supply of BP meds

## 2019-11-24 NOTE — Telephone Encounter (Signed)
Please call patient and have him make a follow up for his blood pressure and we can send in 30 days.

## 2019-12-21 ENCOUNTER — Other Ambulatory Visit: Payer: Self-pay | Admitting: Osteopathic Medicine

## 2020-01-09 ENCOUNTER — Other Ambulatory Visit: Payer: Self-pay | Admitting: Osteopathic Medicine

## 2020-01-10 ENCOUNTER — Telehealth: Payer: Self-pay | Admitting: Osteopathic Medicine

## 2020-01-10 MED ORDER — CLOPIDOGREL BISULFATE 75 MG PO TABS: 75.0000 mg | ORAL_TABLET | Freq: Every day | ORAL | 0 refills | Status: DC

## 2020-01-10 MED ORDER — ROSUVASTATIN CALCIUM 40 MG PO TABS: 40.0000 mg | ORAL_TABLET | Freq: Every day | ORAL | 0 refills | Status: DC

## 2020-01-10 NOTE — Telephone Encounter (Signed)
Patient calling in stating that he is unemployed and wanted to know if he can get a 90 day supply of the clopidogrel (PLAVIX) 75 MG tablet [[id number]], rosuvastatin (CRESTOR) 40 MG tablet [[id number]]. States that given this amount hopefully by then he will have a job.

## 2020-01-10 NOTE — Telephone Encounter (Signed)
Task completed. Per provider, #30 w/no refills sent to the pharmacy. Note added to pharmacy - pt must have labs and NV for BP check.

## 2020-01-14 ENCOUNTER — Other Ambulatory Visit: Payer: Self-pay | Admitting: Osteopathic Medicine

## 2020-01-14 ENCOUNTER — Other Ambulatory Visit: Payer: Self-pay

## 2020-01-14 ENCOUNTER — Ambulatory Visit (INDEPENDENT_AMBULATORY_CARE_PROVIDER_SITE_OTHER): Payer: Self-pay

## 2020-01-14 ENCOUNTER — Encounter: Payer: Self-pay | Admitting: Medical-Surgical

## 2020-01-14 ENCOUNTER — Ambulatory Visit (INDEPENDENT_AMBULATORY_CARE_PROVIDER_SITE_OTHER): Payer: Self-pay | Admitting: Sports Medicine

## 2020-01-14 DIAGNOSIS — M67332 Transient synovitis, left wrist: Secondary | ICD-10-CM

## 2020-01-14 MED ORDER — PREDNISONE 50 MG PO TABS: ORAL_TABLET | ORAL | 0 refills | Status: DC

## 2020-01-14 NOTE — Assessment & Plan Note (Signed)
This is a pleasant 64 year old male, he recalls pulling some weeds, the next day he had severe stiffness, pain and swelling in his left wrist. On exam he has tenderness at the radiocarpal joint from a volar and dorsal approach, difficulty with flexion extension of the fingers. Does not really have any paresthesias. No signs or symptoms of infection, no constitutional symptoms. No prodromal viral symptoms. This is likely transient synovitis, he will wear a Velcro wrist brace for 2 weeks, adding 5 days of prednisone, x-rays, return to see me afterwards and we will likely do an injection first get an MRI if no better.

## 2020-01-14 NOTE — Patient Instructions (Signed)
Tenosynovitis  Tenosynovitis is inflammation of a tendon and of the sleeve of tissue that covers the tendon (tendon sheath). A tendon is a cord of tissue that connects muscle to bone. Normally, a tendon slides smoothly inside its tendon sheath. Tenosynovitis limits movement of the tendon and surrounding tissues, which may cause pain and stiffness. Tenosynovitis can affect any tendon and tendon sheath. Commonly affected areas include tendons in the:  Wrist.  Arm.  Hand.  Hip.  Leg.  Foot.  Shoulder. What are the causes? The main cause of this condition is wear and tear over time that results in slight tears in the tendon. Other possible causes include:  An injury to the tendon or tendon sheath.  A disease that causes inflammation in the body.  An infection that spreads to the tendon and tendon sheath from a skin wound.  An infection in another part of the body that spreads to the tendon and tendon sheath through the blood. What increases the risk? The following factors may make you more likely to develop this condition:  Having rheumatoid arthritis, gout, or diabetes.  Using IV drugs.  Doing physical activities that can cause tendon overuse and stress.  Having gonorrhea. What are the signs or symptoms? Symptoms of this condition depend on the cause. Symptoms may include:  Pain with movement.  Pain when pressing on the tendon and tendon sheath.  Swelling.  Stiffness. If tenosynovitis is caused by an infection, symptoms may include:  Fever.  Redness.  Warmth. How is this diagnosed? This condition may be diagnosed based on your medical history and a physical exam. You also may have:  Blood tests.  Imaging tests, such as: ? MRI. ? Ultrasound.  A sample of fluid removed from inside the tendon sheath to be checked in a lab. How is this treated? Treatment for this condition depends on the cause. If tenosynovitis is not caused by an infection, treatment may  include:  Rest.  Keeping the tendon in place (immobilization) in a splint, brace, or sling.  Taking NSAIDs to reduce pain and swelling.  A shot (injection) of medicine to help reduce pain and swelling (steroid).  Icing or applying heat to the affected area.  Physical therapy.  Surgery to release the tendon in the sheath or to repair damage to the tendon or tendon sheath. Surgery may be done if other treatments do not help relieve symptoms. If tenosynovitis is caused by infection, treatment may include antibiotic medicine given through an IV. In some cases, surgery may be needed to drain fluid from the tendon sheath or to remove the tendon sheath. Follow these instructions at home: If you have a splint, brace, or sling:   Wear the splint, brace, or sling as told by your health care provider. Remove it only as told by your health care provider.  Loosen the splint, brace, or sling if your fingers or toes tingle, become numb, or turn cold and blue.  Keep the splint, brace, or sling clean.  If the splint, brace, or sling is not waterproof: ? Do not let it get wet. ? Cover it with a watertight covering when you take a bath or shower. Managing pain, stiffness, and swelling   If directed, put ice on the affected area. ? Put ice in a plastic bag. ? Place a towel between your skin and the bag. ? Leave the ice on for 20 minutes, 2-3 times a day.  Move the fingers or toes of the affected limb often, if   this applies. This can help to reduce stiffness and swelling.  If directed, raise (elevate) the affected area above the level of your heart while you are sitting or lying down.  If directed, apply heat to the affected area before you exercise. Use the heat source that your health care provider recommends, such as a moist heat pack or a heating pad. ? Place a towel between your skin and the heat source. ? Leave the heat on for 20-30 minutes. ? Remove the heat if your skin turns bright  red. This is especially important if you are unable to feel pain, heat, or cold. You may have a greater risk of getting burned. Medicines  Take over-the-counter and prescription medicines only as told by your health care provider.  Ask your health care provider if the medicine prescribed to you: ? Requires you to avoid driving or using heavy machinery. ? Can cause constipation. You may need to take actions to prevent or treat constipation, such as:  Drink enough fluid to keep your urine pale yellow.  Take over-the-counter or prescription medicines.  Eat foods that are high in fiber, such as beans, whole grains, and fresh fruits and vegetables.  Limit foods that are high in fat and processed sugars, such as fried or sweet foods. Activity  Return to your normal activities as told by your health care provider. Ask your health care provider what activities are safe for you.  Rest the affected area as told by your health care provider.  Avoid using the affected area while you are having symptoms.  Do not use the injured limb to support your body weight until your health care provider says that you can.  If physical therapy was prescribed, do exercises as told by your health care provider. General instructions  Ask your health care provider when it is safe to drive if you have a splint or brace on any part of your arm or leg.  Keep all follow-up visits as told by your health care provider. This is important. Contact a health care provider if:  Your symptoms are not improving or are getting worse. Get help right away if:  Your fingers or toes become numb or turn blue.  You have a fever and more of any of the following symptoms: ? Pain. ? Redness. ? Warmth. ? Swelling. Summary  Tenosynovitis is inflammation of a tendon and of the sleeve of tissue that covers the tendon (tendon sheath).  Treatment for this condition depends on the cause. Treatment may include rest, medicines,  physical therapy, or surgery.  Contact a health care provider if your symptoms are not improving or are getting worse.  Keep all follow-up visits as told by your health care provider. This is important. This information is not intended to replace advice given to you by your health care provider. Make sure you discuss any questions you have with your health care provider. Document Revised: 03/26/2018 Document Reviewed: 03/26/2018 Elsevier Patient Education  2020 [employer]  

## 2020-01-14 NOTE — Progress Notes (Signed)
    Procedures performed today:    None.  Independent interpretation of notes and tests performed by another provider:   None.  Brief History, Exam, Impression, and Recommendations:    Transient synovitis of left wrist This is a pleasant 64 year old male, he recalls pulling some weeds, the next day he had severe stiffness, pain and swelling in his left wrist. On exam he has tenderness at the radiocarpal joint from a volar and dorsal approach, difficulty with flexion extension of the fingers. Does not really have any paresthesias. No signs or symptoms of infection, no constitutional symptoms. No prodromal viral symptoms. This is likely transient synovitis, he will wear a Velcro wrist brace for 2 weeks, adding 5 days of prednisone, x-rays, return to see me afterwards and we will likely do an injection first get an MRI if no better.    ___________________________________________ Ihor Austin. Benjamin Stain, M.D., ABFM., CAQSM. Primary Care and Sports Medicine [facility]  Adjunct Instructor of Family Medicine  [employer]

## 2020-01-24 ENCOUNTER — Ambulatory Visit (INDEPENDENT_AMBULATORY_CARE_PROVIDER_SITE_OTHER): Payer: Self-pay | Admitting: Osteopathic Medicine

## 2020-01-24 ENCOUNTER — Encounter: Payer: Self-pay | Admitting: Osteopathic Medicine

## 2020-01-24 VITALS — BP 132/89 | HR 69 | Temp 98.0°F

## 2020-01-24 DIAGNOSIS — Z0000 Encounter for general adult medical examination without abnormal findings: Secondary | ICD-10-CM

## 2020-01-24 DIAGNOSIS — E782 Mixed hyperlipidemia: Secondary | ICD-10-CM

## 2020-01-24 DIAGNOSIS — I10 Essential (primary) hypertension: Secondary | ICD-10-CM

## 2020-01-24 DIAGNOSIS — I2510 Atherosclerotic heart disease of native coronary artery without angina pectoris: Secondary | ICD-10-CM

## 2020-01-24 DIAGNOSIS — Z125 Encounter for screening for malignant neoplasm of prostate: Secondary | ICD-10-CM

## 2020-01-24 MED ORDER — METOPROLOL TARTRATE 50 MG PO TABS: 50.0000 mg | ORAL_TABLET | Freq: Two times a day (BID) | ORAL | 3 refills | Status: DC

## 2020-01-24 MED ORDER — ROSUVASTATIN CALCIUM 40 MG PO TABS: 40.0000 mg | ORAL_TABLET | Freq: Every day | ORAL | 3 refills | Status: DC

## 2020-01-24 MED ORDER — OMEGA-3 FATTY ACIDS 1000 MG PO CAPS: 2.0000 g | ORAL_CAPSULE | Freq: Every day | ORAL | 3 refills | Status: AC

## 2020-01-24 MED ORDER — LISINOPRIL 10 MG PO TABS: 10.0000 mg | ORAL_TABLET | Freq: Every day | ORAL | 3 refills | Status: DC

## 2020-01-24 MED ORDER — CLOPIDOGREL BISULFATE 75 MG PO TABS: 75.0000 mg | ORAL_TABLET | Freq: Every day | ORAL | 3 refills | Status: DC

## 2020-01-24 NOTE — Patient Instructions (Addendum)
General Preventive Care  Most recent routine screening labs: ordered today.   BP goal 130/80 or less.   Tobacco: don't! Please let me know if you need help quitting!  Alcohol: responsible moderation is ok for most adults - if you have concerns about your alcohol intake, please talk to me!   Exercise as tolerated  Mental health: if need for mental health care (medicines, counseling, other), or concerns about moods, please let me know!   Sexual health: if need for STD testing, or if concerns with libido/pain problems, please let me know!   Advanced Directive: Living Will and/or Healthcare Power of Attorney recommended for all adults, regardless of age or health.  Vaccines  Flu vaccine: for almost everyone, every fall.   Shingles vaccine: after age 79. Wait 2 weeks after COVID vaccine  Pneumonia vaccines: after age 21, or sooner if certain medical conditions.  Tetanus booster: every 10 years, due 2027  COVID vaccine: THANKS for taking your vaccine!  Cancer screenings   Colon cancer screening: for everyone age 54-75. Follow up as directed by GI based on last colonoscopy results   Prostate cancer screening: PSA blood test age 39-71  Lung cancer screening: CT chest every year for those age 83 to 62 years old with ?30 pack year smoking history, who either currently smoke or have quit within the past 15 years. Infection screenings  . HIV: recommended screening at least once age 74-65, more often as needed. . Gonorrhea/Chlamydia: screening as needed . Hepatitis C: recommended once for everyone age 68-75 . TB: certain at-risk populations, or depending on work requirements and/or travel history Other . Bone Density Test: recommended for men at age 25, sooner depending on risk factors . Abdominal Aortic Aneurysm: screening with ultrasound recommended once for men age 65-75 who have ever smoked

## 2020-01-24 NOTE — Progress Notes (Signed)
Craig Mccormick is a 64 y.o. male who presents to  [facility] at [facility]  today, 01/24/20, seeking care for the following: . Annual . BP above goal today, not checking at home, notes some weight gain      ASSESSMENT & PLAN with other pertinent history/findings:  The primary encounter diagnosis was Annual physical exam. Diagnoses of Coronary artery disease involving native coronary artery of native heart without angina pectoris, Essential hypertension, Prostate cancer screening, Mixed hyperlipidemia, and Preventative health care were also pertinent to this visit.    Patient Instructions  General Preventive Care  Most recent routine screening labs: ordered today.   BP goal 130/80 or less.   Tobacco: don't! Please let me know if you need help quitting!  Alcohol: responsible moderation is ok for most adults - if you have concerns about your alcohol intake, please talk to me!   Exercise as tolerated  Mental health: if need for mental health care (medicines, counseling, other), or concerns about moods, please let me know!   Sexual health: if need for STD testing, or if concerns with libido/pain problems, please let me know!   Advanced Directive: Living Will and/or Healthcare Power of Attorney recommended for all adults, regardless of age or health.  Vaccines  Flu vaccine: for almost everyone, every fall.   Shingles vaccine: after age 54. Wait 2 weeks after COVID vaccine  Pneumonia vaccines: after age 14, or sooner if certain medical conditions.  Tetanus booster: every 10 years, due 2027  COVID vaccine: THANKS for taking your vaccine!  Cancer screenings   Colon cancer screening: for everyone age 31-75. Follow up as directed by GI based on last colonoscopy results   Prostate cancer screening: PSA blood test age 40-71  Lung cancer screening: CT chest every year for those age 11 to 62 years old with ?30 pack year smoking history,  who either currently smoke or have quit within the past 15 years. Infection screenings  . HIV: recommended screening at least once age 7-65, more often as needed. . Gonorrhea/Chlamydia: screening as needed . Hepatitis C: recommended once for everyone age 17-75 . TB: certain at-risk populations, or depending on work requirements and/or travel history Other . Bone Density Test: recommended for men at age 36, sooner depending on risk factors . Abdominal Aortic Aneurysm: screening with ultrasound recommended once for men age 25-75 who have ever smoked      Orders Placed This Encounter  Procedures  . CBC  . COMPLETE METABOLIC PANEL WITH GFR  . Lipid panel  . PSA, Total with Reflex to PSA, Free    No orders of the defined types were placed in this encounter. Constitutional:  . VSS, see nurse notes . General Appearance: alert, well-developed, well-nourished, NAD Eyes: Marland Kitchen Normal lids and conjunctive, non-icteric sclera Neck: . No masses, trachea midline . No thyroid enlargement/tenderness/mass appreciated Respiratory: . Normal respiratory effort . Breath sounds normal, no wheeze/rhonchi/rales Cardiovascular: . S1/S2 normal, no murmur/rub/gallop auscultated . No lower extremity edema Gastrointestinal: . Nontender, no masses . No hepatomegaly, no splenomegaly . No hernia appreciated Musculoskeletal:  . Gait normal . No clubbing/cyanosis of digits Neurological: . No cranial nerve deficit on limited exam . Motor and sensation intact and symmetric Psychiatric: . Normal judgment/insight . Normal mood and affect      Follow-up instructions: Return for NURSE VISIT SHINGIRX #1 AT LEAST 14 DAYS AFTER COVID VACCINE. Marland Kitchen   Immunization History  Administered Date(s) Administered  . Influenza  Split 05/19/2012  . Influenza Whole 05/19/2013  . Influenza, Seasonal, Injecte, Preservative Fre 05/08/2017  . Influenza,inj,Quad PF,6+ Mos 05/12/2019  . Influenza-Unspecified 05/08/2017   . Moderna SARS-COVID-2 Vaccination 01/02/2020  . Tdap 08/19/2004, 10/14/2015                                         There were no vitals taken for this visit.  No outpatient medications have been marked as taking for the 01/24/20 encounter (Office Visit) with Emeterio Reeve, DO.    No results found for this or any previous visit (from the past 72 hour(s)).  No results found.  Depression screen [facility] 2/9 12/04/2018 04/15/2017  Decreased Interest 0 0  Down, Depressed, Hopeless 0 0  PHQ - 2 Score 0 0    GAD 7 : Generalized Anxiety Score 12/04/2018  Nervous, Anxious, on Edge 0  Control/stop worrying 0  Worry too much - different things 0  Trouble relaxing 0  Restless 0  Easily annoyed or irritable 0  Afraid - awful might happen 0  Total GAD 7 Score 0   BP Readings from Last 3 Encounters:  01/24/20 132/89  01/14/20 (!) 144/89  12/04/18 (!) 144/86      All questions at time of visit were answered - patient instructed to contact office with any additional concerns or updates.  ER/RTC precautions were reviewed with the patient.  Please note: voice recognition software was used to produce this document, and typos may escape review. Please contact Dr. Sheppard Coil for any needed clarifications.

## 2020-01-25 LAB — CBC
HCT: 43.5 % (ref 38.5–50.0)
Hemoglobin: 15 g/dL (ref 13.2–17.1)
MCH: 32.9 pg (ref 27.0–33.0)
MCHC: 34.5 g/dL (ref 32.0–36.0)
MCV: 95.4 fL (ref 80.0–100.0)
MPV: 9.9 fL (ref 7.5–12.5)
Platelets: 110 10*3/uL — ABNORMAL LOW (ref 140–400)
RBC: 4.56 10*6/uL (ref 4.20–5.80)
RDW: 12.3 % (ref 11.0–15.0)
WBC: 5.5 10*3/uL (ref 3.8–10.8)

## 2020-01-25 LAB — LIPID PANEL
Cholesterol: 225 mg/dL — ABNORMAL HIGH (ref <200)
HDL: 46 mg/dL (ref >=40)
LDL Cholesterol (Calc): 125 mg/dL (calc) — ABNORMAL HIGH
Non-HDL Cholesterol (Calc): 179 mg/dL (calc) — ABNORMAL HIGH (ref <130)
Total CHOL/HDL Ratio: 4.9 (calc) (ref <5.0)
Triglycerides: 381 mg/dL — ABNORMAL HIGH (ref <150)

## 2020-01-25 LAB — COMPLETE METABOLIC PANEL WITH GFR
AG Ratio: 1.7 (calc) (ref 1.0–2.5)
ALT: 19 U/L (ref 9–46)
AST: 17 U/L (ref 10–35)
Albumin: 3.8 g/dL (ref 3.6–5.1)
Alkaline phosphatase (APISO): 63 U/L (ref 35–144)
BUN: 15 mg/dL (ref 7–25)
CO2: 26 mmol/L (ref 20–32)
Calcium: 8.7 mg/dL (ref 8.6–10.3)
Chloride: 104 mmol/L (ref 98–110)
Creat: 0.87 mg/dL (ref 0.70–1.25)
GFR, Est African American: 107 mL/min/{1.73_m2} (ref >=60)
GFR, Est Non African American: 92 mL/min/{1.73_m2} (ref >=60)
Globulin: 2.2 g/dL (calc) (ref 1.9–3.7)
Glucose, Bld: 112 mg/dL — ABNORMAL HIGH (ref 65–99)
Potassium: 4.1 mmol/L (ref 3.5–5.3)
Sodium: 139 mmol/L (ref 135–146)
Total Bilirubin: 0.6 mg/dL (ref 0.2–1.2)
Total Protein: 6 g/dL — ABNORMAL LOW (ref 6.1–8.1)

## 2020-01-25 LAB — PSA, TOTAL WITH REFLEX TO PSA, FREE: PSA, Total: 0.3 ng/mL (ref <=4.0)

## 2020-01-27 ENCOUNTER — Encounter: Payer: Self-pay | Admitting: Osteopathic Medicine

## 2020-01-28 ENCOUNTER — Ambulatory Visit (INDEPENDENT_AMBULATORY_CARE_PROVIDER_SITE_OTHER): Payer: Self-pay | Admitting: Sports Medicine

## 2020-01-28 ENCOUNTER — Encounter: Payer: Self-pay | Admitting: Sports Medicine

## 2020-01-28 DIAGNOSIS — M67332 Transient synovitis, left wrist: Secondary | ICD-10-CM

## 2020-01-28 NOTE — Assessment & Plan Note (Signed)
Craig Mccormick returns, he is a pleasant 64 year old male, last month he was pulling some weeds, had severe pain and stiffness the next day. He did have tenderness at the radiocarpal joint both from a volar and dorsal approach with a little bit of swelling and palpable synovitis, we diagnosed him with transient synovitis. He had no prodromal viral symptoms. We added a Velcro brace, 5 days of prednisone, he returns today symptom-free, exam is benign, return as needed.

## 2020-01-28 NOTE — Progress Notes (Signed)
    Procedures performed today:    None.  Independent interpretation of notes and tests performed by another provider:   None.  Brief History, Exam, Impression, and Recommendations:    Transient synovitis of left wrist Craig Mccormick returns, he is a pleasant 64 year old male, last month he was pulling some weeds, had severe pain and stiffness the next day. He did have tenderness at the radiocarpal joint both from a volar and dorsal approach with a little bit of swelling and palpable synovitis, we diagnosed him with transient synovitis. He had no prodromal viral symptoms. We added a Velcro brace, 5 days of prednisone, he returns today symptom-free, exam is benign, return as needed.    ___________________________________________ Craig Mccormick. Craig Mccormick, M.D., ABFM., CAQSM. Primary Care and Sports Medicine [facility]  Adjunct Instructor of Family Medicine  [employer]

## 2020-09-20 IMAGING — DX DG WRIST COMPLETE 3+V*L*
4 series · 4 of 4 positions shown · non-contrast
Comparison: None.

CLINICAL DATA: Pain and swelling

EXAM:
LEFT WRIST - COMPLETE 3+ VIEW

[wrist pa]
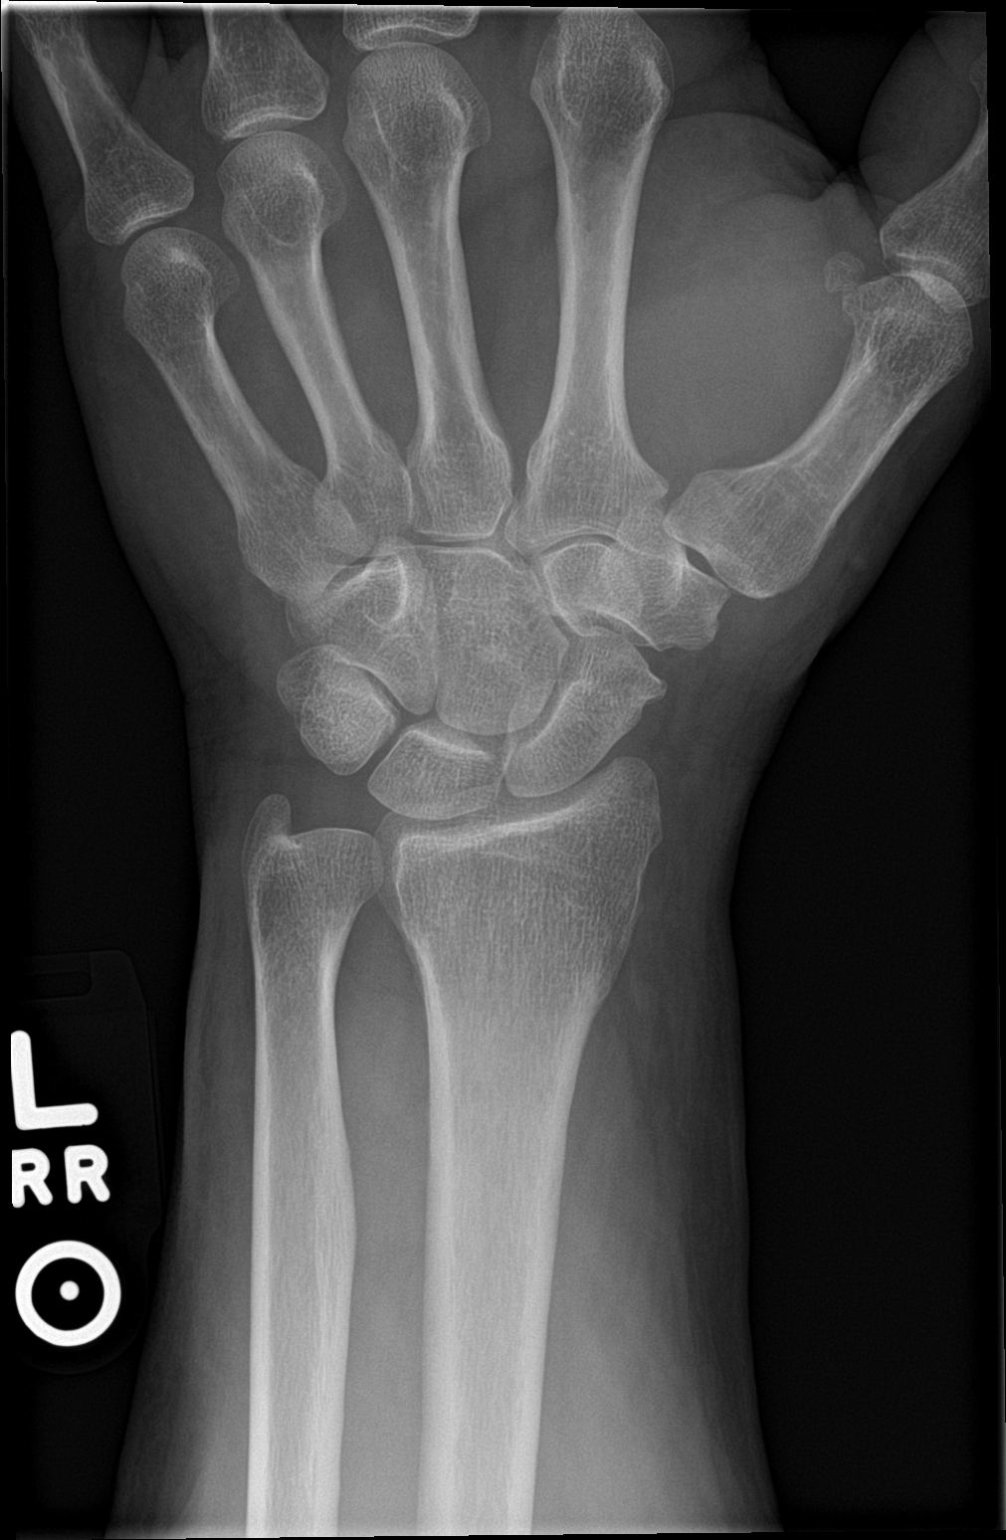

[wrist obl]
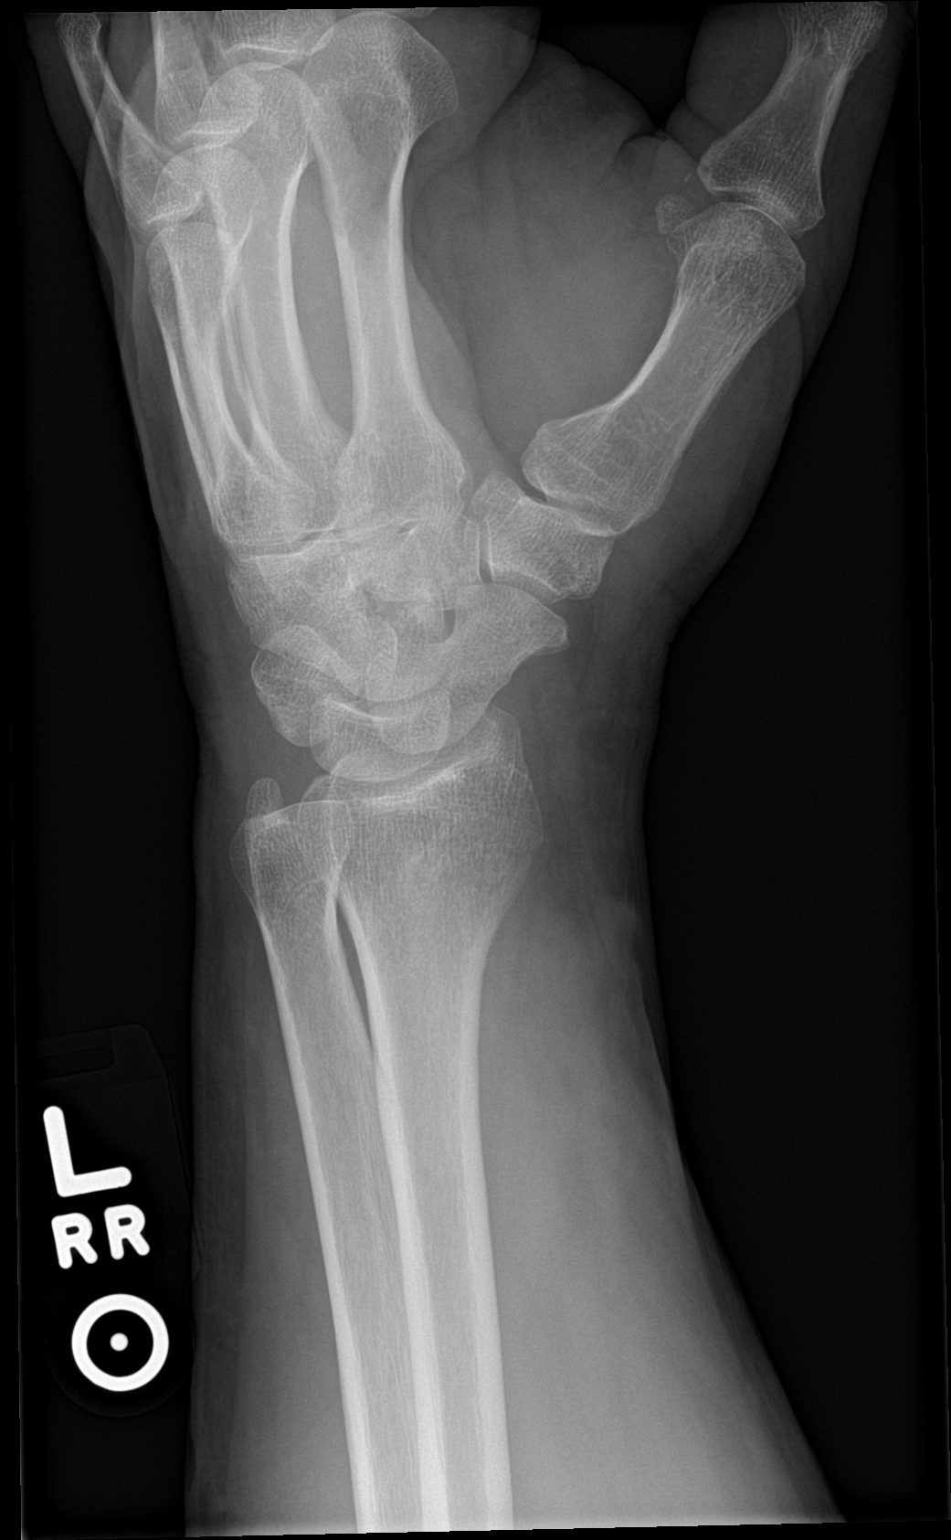

[wrist lat]
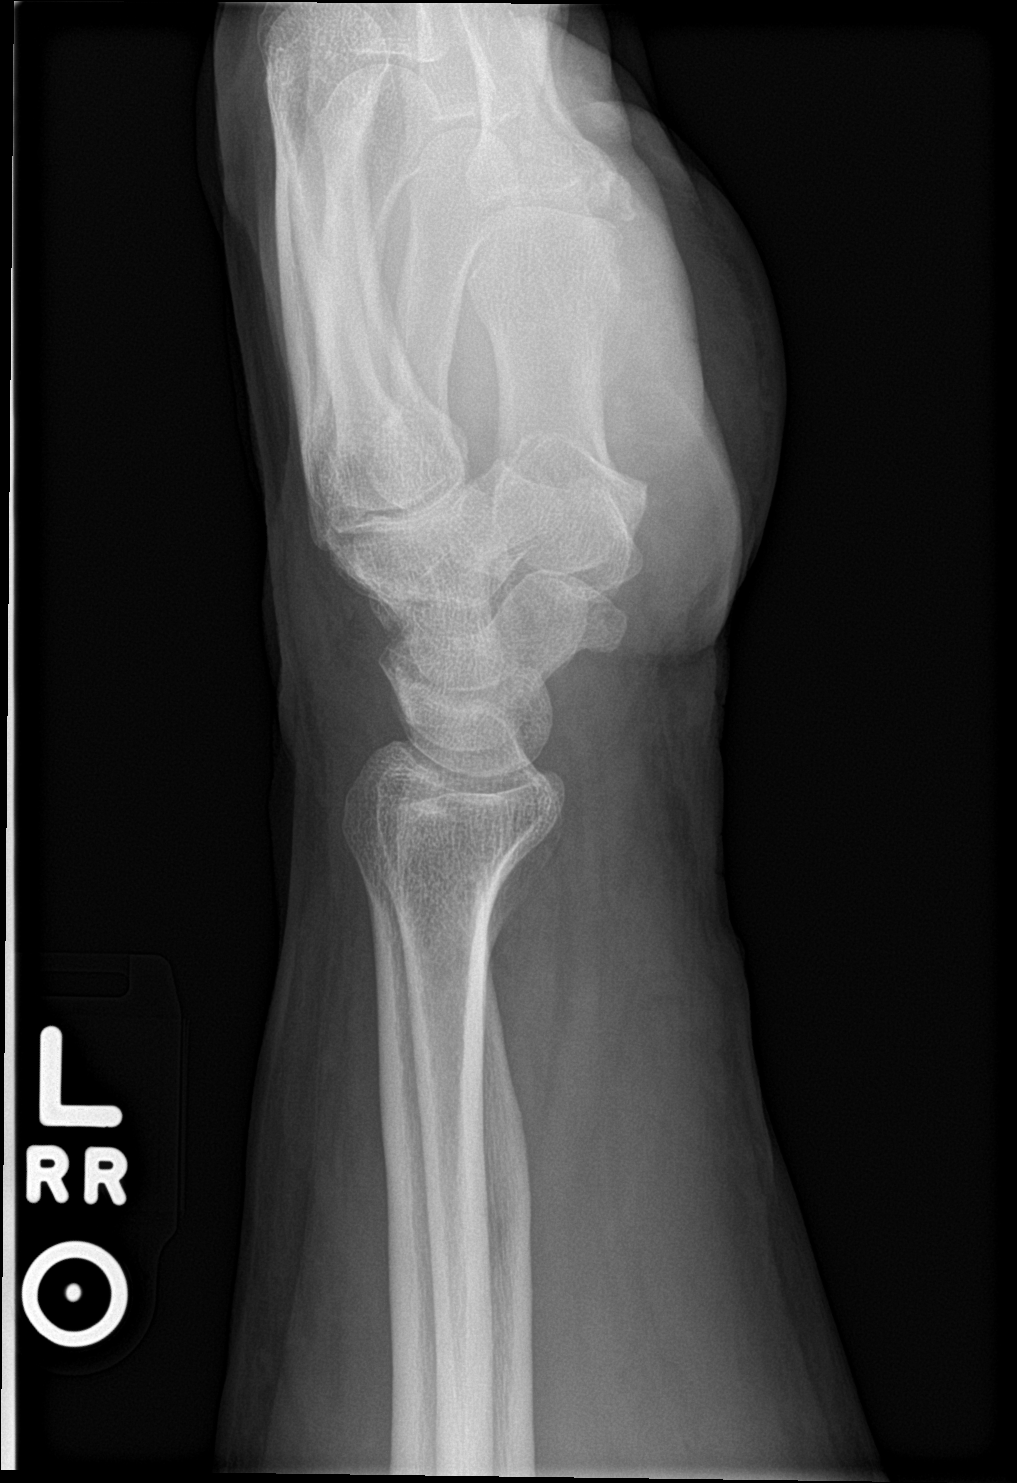

[wrist navicular]
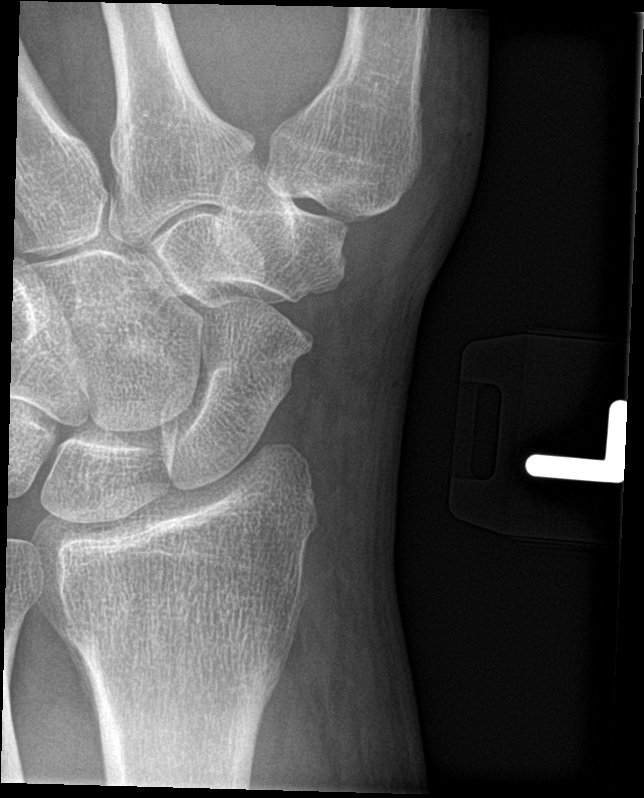

[4 of 4 positions shown; findings below may reference images not displayed]

FINDINGS: Frontal, oblique, lateral, and ulnar deviation scaphoid images were
obtained. No evident fracture or dislocation. Joint spaces appear
normal. No erosive change or intra-articular calcification.
IMPRESSION: No fracture or dislocation.  No evident arthropathy.

## 2020-12-20 DIAGNOSIS — Z79899 Other long term (current) drug therapy: Secondary | ICD-10-CM | POA: Diagnosis not present

## 2020-12-20 DIAGNOSIS — K4020 Bilateral inguinal hernia, without obstruction or gangrene, not specified as recurrent: Secondary | ICD-10-CM | POA: Diagnosis not present

## 2020-12-20 DIAGNOSIS — I700 Atherosclerosis of aorta: Secondary | ICD-10-CM | POA: Diagnosis not present

## 2020-12-20 DIAGNOSIS — R300 Dysuria: Secondary | ICD-10-CM | POA: Diagnosis not present

## 2020-12-20 DIAGNOSIS — F17210 Nicotine dependence, cigarettes, uncomplicated: Secondary | ICD-10-CM | POA: Diagnosis not present

## 2020-12-20 DIAGNOSIS — R1032 Left lower quadrant pain: Secondary | ICD-10-CM | POA: Diagnosis not present

## 2020-12-20 DIAGNOSIS — R112 Nausea with vomiting, unspecified: Secondary | ICD-10-CM | POA: Diagnosis not present

## 2020-12-20 DIAGNOSIS — Z7902 Long term (current) use of antithrombotics/antiplatelets: Secondary | ICD-10-CM | POA: Diagnosis not present

## 2020-12-20 DIAGNOSIS — Z881 Allergy status to other antibiotic agents status: Secondary | ICD-10-CM | POA: Diagnosis not present

## 2020-12-20 DIAGNOSIS — Z87442 Personal history of urinary calculi: Secondary | ICD-10-CM | POA: Diagnosis not present

## 2020-12-20 DIAGNOSIS — N132 Hydronephrosis with renal and ureteral calculous obstruction: Secondary | ICD-10-CM | POA: Diagnosis not present

## 2020-12-20 DIAGNOSIS — I10 Essential (primary) hypertension: Secondary | ICD-10-CM | POA: Diagnosis not present

## 2020-12-20 DIAGNOSIS — K429 Umbilical hernia without obstruction or gangrene: Secondary | ICD-10-CM | POA: Diagnosis not present

## 2020-12-21 ENCOUNTER — Telehealth: Payer: Self-pay | Admitting: General Practice

## 2020-12-21 NOTE — Telephone Encounter (Signed)
Transition Care Management Follow-up Telephone Call  Date of discharge and from where: [facility] 12/20/20  How have you been since you were released from the hospital? Doing ok; still has abdominal pain.  Any questions or concerns? No  Items Reviewed:  Did the pt receive and understand the discharge instructions provided? Yes   Medications obtained and verified? Yes   Other? No   Any new allergies since your discharge? No   Dietary orders reviewed? Yes  Do you have support at home? Yes   Home Care and Equipment/Supplies: Were home health services ordered? no   Functional Questionnaire: (I = Independent and D = Dependent) ADLs: I  Bathing/Dressing- I  Meal Prep- I  Eating- I  Maintaining continence- I  Transferring/Ambulation- I  Managing Meds- I  Follow up appointments reviewed:   PCP Hospital f/u appt confirmed? No  Patient stated that he will call back to schedule.  Specialist Hospital f/u appt confirmed? No  Patient stated he has a urologist and he will call and schedule the appointment.  Are transportation arrangements needed? No   If their condition worsens, is the pt aware to call PCP or go to the Emergency Dept.? Yes  Was the patient provided with contact information for the PCP's office or ED? Yes  Was to pt encouraged to call back with questions or concerns? Yes

## 2020-12-29 ENCOUNTER — Ambulatory Visit (INDEPENDENT_AMBULATORY_CARE_PROVIDER_SITE_OTHER): Payer: BC Managed Care – PPO | Admitting: Osteopathic Medicine

## 2020-12-29 ENCOUNTER — Encounter: Payer: Self-pay | Admitting: Osteopathic Medicine

## 2020-12-29 ENCOUNTER — Other Ambulatory Visit: Payer: Self-pay

## 2020-12-29 VITALS — BP 150/93 | HR 71 | Temp 98.5°F | Wt 244.1 lb

## 2020-12-29 DIAGNOSIS — E782 Mixed hyperlipidemia: Secondary | ICD-10-CM

## 2020-12-29 DIAGNOSIS — I2510 Atherosclerotic heart disease of native coronary artery without angina pectoris: Secondary | ICD-10-CM

## 2020-12-29 DIAGNOSIS — N200 Calculus of kidney: Secondary | ICD-10-CM | POA: Diagnosis not present

## 2020-12-29 DIAGNOSIS — Z125 Encounter for screening for malignant neoplasm of prostate: Secondary | ICD-10-CM

## 2020-12-29 DIAGNOSIS — Z0000 Encounter for general adult medical examination without abnormal findings: Secondary | ICD-10-CM

## 2020-12-29 DIAGNOSIS — I10 Essential (primary) hypertension: Secondary | ICD-10-CM

## 2020-12-29 MED ORDER — TAMSULOSIN HCL 0.4 MG PO CAPS: 0.8000 mg | ORAL_CAPSULE | Freq: Every day | ORAL | 1 refills | Status: DC

## 2020-12-29 MED ORDER — HYDROCODONE-ACETAMINOPHEN 5-325 MG PO TABS: 1.0000 | ORAL_TABLET | Freq: Four times a day (QID) | ORAL | 0 refills | Status: AC | PRN

## 2020-12-29 NOTE — Progress Notes (Signed)
Craig Mccormick is a 65 y.o. male who presents to  [place] at [facility]  today, 12/29/20, seeking care for the following:  . HFU kidney stones: in pain today, PDMP reviewed, he was given limited Rx for pain meds from ER. ER visit 9 days ago. Pain is ongoing btu he has 1 pain pill left, he has not arranged urology f/u yet, thinks might have passed some of a stone.      ASSESSMENT & PLAN with other pertinent findings:  The primary encounter diagnosis was Renal calculus, left. Diagnoses of Annual physical exam, Prostate cancer screening, Essential hypertension, Mixed hyperlipidemia, and Coronary artery disease involving native coronary artery of native heart without angina pectoris were also pertinent to this visit.   1. Renal calculus, left Increase Flomax Refill pain Rx Urgent urology referral Pt declined repeat CT at this time, I think reasonable since pain is tolerable / not worse and no fever / abdominal pain but would get him seen by urology ASAP given hydronephrosis and stranding on CT   2. Annual physical exam 3. Prostate cancer screening 4. Essential hypertension 5. Mixed hyperlipidemia 6. Coronary artery disease involving native coronary artery of native heart without angina pectoris Labs ordered for future visit. Annual physical / preventive care was NOT performed or billed today.    There are no Patient Instructions on file for this visit.  Orders Placed This Encounter  Procedures  . CBC  . CMP14+EGFR  . Lipid panel  . PSA Total (Reflex To Free)  . Ambulatory referral to Urology    Meds ordered this encounter  Medications  . HYDROcodone-acetaminophen (NORCO/VICODIN) 5-325 MG tablet    Sig: Take 1-2 tablets by mouth every 6 (six) hours as needed for up to 5 days for moderate pain.    Dispense:  30 tablet    Refill:  0  . tamsulosin (FLOMAX) 0.4 MG CAPS capsule    Sig: Take 2 capsules (0.8 mg total) by mouth daily.     Dispense:  60 capsule    Refill:  1     See below for relevant physical exam findings  See below for recent lab and imaging results reviewed  Medications, allergies, PMH, PSH, SocH, FamH reviewed below    Follow-up instructions: Return in about 3 months (around 03/31/2021) for ANNUAL (call week prior to visit for lab orders), SEE [place] SOONER IF NEEDED. CALL/MESSAGE W/ QUESTIONS.                                        Exam:  BP (!) 150/93 (BP Location: Left Arm, Patient Position: Sitting, Cuff Size: Large)   Pulse 71   Temp 98.5 F (36.9 C) (Oral)   Wt 244 lb 1.3 oz (110.7 kg)   BMI 37.11 kg/m   Constitutional: VS see above. General Appearance: alert, well-developed, well-nourished, NAD  Neck: No masses, trachea midline.   Respiratory: Normal respiratory effort. no wheeze, no rhonchi, no rales  Cardiovascular: S1/S2 normal, no murmur, no rub/gallop auscultated. RRR.   Musculoskeletal: Gait normal. Symmetric and independent movement of all extremities  Neurological: Normal balance/coordination. No tremor.  Skin: warm, dry, intact.   Psychiatric: Normal judgment/insight. Normal mood and affect. Oriented x3.   Current Meds  Medication Sig  . aspirin 81 MG tablet Take 81 mg by mouth daily.  . clopidogrel (PLAVIX) 75 MG tablet Take  1 tablet (75 mg total) by mouth daily.  . Coenzyme Q10 (CO Q-10 PO) Take by mouth.  . fish oil-omega-3 fatty acids 1000 MG capsule Take 2 capsules (2 g total) by mouth daily.  Marland Kitchen HYDROcodone-acetaminophen (NORCO/VICODIN) 5-325 MG tablet Take 1-2 tablets by mouth every 6 (six) hours as needed for up to 5 days for moderate pain.  Marland Kitchen lisinopril (ZESTRIL) 10 MG tablet Take 1 tablet (10 mg total) by mouth daily.  . metoprolol tartrate (LOPRESSOR) 50 MG tablet Take 1 tablet (50 mg total) by mouth 2 (two) times daily.  . Multiple Vitamins-Minerals (ZINC PO) Take by mouth.  . rosuvastatin (CRESTOR) 40 MG tablet Take 1  tablet (40 mg total) by mouth daily.  . tamsulosin (FLOMAX) 0.4 MG CAPS capsule Take 2 capsules (0.8 mg total) by mouth daily.    Allergies  Allergen Reactions  . Doxycycline     Vomiting    Patient Active Problem List   Diagnosis Date Noted  . Transient synovitis of left wrist 01/14/2020  . Closed nondisplaced fracture of condyle of right femur ([place]) 09/25/2017  . Lateral epicondylitis of left elbow 08/18/2017  . Arthritis of knee 04/15/2017  . Aortic sclerosis-mild 02/09/2015  . Closed fracture of left femur ([place]) 08/09/2014  . Closed fracture of lateral malleolus of right ankle 12/07/2013  . Preventative health care 08/27/2013  . Hyperlipidemia 06/18/2013  . CAD (coronary artery disease) 12/23/2012  . S/P PTCA (percutaneous transluminal coronary angioplasty) 12/23/2012  . Hypertension 06/11/2012    Family History  Problem Relation Age of Onset  . Lung cancer Mother   . Heart disease Father   . Prostate cancer Father   . Diabetes Father   . Hyperlipidemia Brother   . Colon cancer Neg Hx   . Throat cancer Neg Hx   . Kidney disease Neg Hx   . Liver disease Neg Hx     Social History   Tobacco Use  Smoking Status Current Every Day Smoker  . Packs/day: 0.50  . Types: Cigarettes  Smokeless Tobacco Never Used  Tobacco Comment   pt attempting to d/c; form given 07-30-13    Past Surgical History:  Procedure Laterality Date  . CORONARY ANGIOPLASTY WITH STENT PLACEMENT    . SKIN CANCER DESTRUCTION    . SKIN CANCER EXCISION      Immunization History  Administered Date(s) Administered  . Influenza Split 05/19/2012  . Influenza Whole 05/19/2013  . Influenza, Seasonal, Injecte, Preservative Fre 05/08/2017  . Influenza,inj,Quad PF,6+ Mos 05/12/2019  . Influenza-Unspecified 05/08/2017, 06/07/2020  . Moderna Sars-Covid-2 Vaccination 01/02/2020, 01/30/2020  . Tdap 08/19/2004, 10/14/2015    No results found for this or any previous visit (from the past 2160  hour(s)).  No results found.     All questions at time of visit were answered - patient instructed to contact office with any additional concerns or updates. ER/RTC precautions were reviewed with the patient as applicable.   Please note: manual typing as well as voice recognition software may have been used to produce this document - typos may escape review. Please contact Dr. Sheppard Coil for any needed clarifications.   Total encounter time on date of service, 12/29/20, was 30 minutes spent addressing problems/issues as noted above in [place], including time spent in discussion with patient regarding the HPI, ROS, confirming history, reviewing Assessment & Plan, as well as time spent on coordination of care, record review.

## 2021-01-12 ENCOUNTER — Other Ambulatory Visit: Payer: Self-pay | Admitting: Osteopathic Medicine

## 2021-01-18 DIAGNOSIS — N200 Calculus of kidney: Secondary | ICD-10-CM | POA: Diagnosis not present

## 2021-01-26 ENCOUNTER — Other Ambulatory Visit: Payer: Self-pay | Admitting: Osteopathic Medicine

## 2021-03-07 ENCOUNTER — Other Ambulatory Visit: Payer: Self-pay | Admitting: Osteopathic Medicine

## 2021-03-16 ENCOUNTER — Other Ambulatory Visit: Payer: Self-pay | Admitting: Osteopathic Medicine

## 2021-04-27 ENCOUNTER — Other Ambulatory Visit: Payer: Self-pay | Admitting: Osteopathic Medicine

## 2021-06-06 ENCOUNTER — Encounter: Payer: Self-pay | Admitting: Family Medicine

## 2021-06-06 ENCOUNTER — Ambulatory Visit (INDEPENDENT_AMBULATORY_CARE_PROVIDER_SITE_OTHER): Payer: BC Managed Care – PPO | Admitting: Family Medicine

## 2021-06-06 ENCOUNTER — Other Ambulatory Visit: Payer: Self-pay

## 2021-06-06 VITALS — BP 147/91 | HR 78 | Temp 97.4°F | Ht 68.0 in | Wt 243.5 lb

## 2021-06-06 DIAGNOSIS — I10 Essential (primary) hypertension: Secondary | ICD-10-CM | POA: Diagnosis not present

## 2021-06-06 DIAGNOSIS — E782 Mixed hyperlipidemia: Secondary | ICD-10-CM

## 2021-06-06 DIAGNOSIS — Z0000 Encounter for general adult medical examination without abnormal findings: Secondary | ICD-10-CM

## 2021-06-06 DIAGNOSIS — Z23 Encounter for immunization: Secondary | ICD-10-CM

## 2021-06-06 DIAGNOSIS — Z125 Encounter for screening for malignant neoplasm of prostate: Secondary | ICD-10-CM | POA: Diagnosis not present

## 2021-06-06 DIAGNOSIS — K219 Gastro-esophageal reflux disease without esophagitis: Secondary | ICD-10-CM

## 2021-06-06 MED ORDER — PANTOPRAZOLE SODIUM 40 MG PO TBEC: 40.0000 mg | DELAYED_RELEASE_TABLET | Freq: Every day | ORAL | 0 refills | Status: DC

## 2021-06-06 NOTE — Assessment & Plan Note (Addendum)
Possible reflux that could be contributing to the tickle sensation in his throat as well as dry heaving.  Adding pantoprazole to see if this helps with these symptoms.

## 2021-06-06 NOTE — Progress Notes (Signed)
Craig Mccormick - 65 y.o. male MRN [id number]  Date of birth: 04-18-1956  Subjective Chief Complaint  Patient presents with   Establish Care    HPI Craig Mccormick is a 65 year old male here today for annual exam.  He is a former patient of Dr. Lyn Hollingshead and this is his first visit with me.  He has history of hypertension, coronary artery disease, and hyperlipidemia.  He reports he is doing well with current medications.  He reports symptoms of recurrent sensation of a tickle in the back of his throat which causes him to dry heave.  He denies cough with this.  Symptoms are typically worse in the morning.  He does report having some reflux symptoms which he takes Tums for.  He is a daily smoker.  He did have some success with Nicorette gum in the past.  He does not feel he is ready to quit again at this time.  He has 1 alcoholic beverage daily.  He does walk some for exercise.  He feels like his diet is pretty healthy.  Review of Systems  Constitutional:  Negative for chills, fever, malaise/fatigue and weight loss.  HENT:  Negative for congestion, ear pain and sore throat.   Eyes:  Negative for blurred vision, double vision and pain.  Respiratory:  Negative for cough and shortness of breath.   Cardiovascular:  Negative for chest pain and palpitations.  Gastrointestinal:  Negative for abdominal pain, blood in stool, constipation, heartburn and nausea.  Genitourinary:  Negative for dysuria and urgency.  Musculoskeletal:  Negative for joint pain and myalgias.  Neurological:  Negative for dizziness and headaches.  Endo/Heme/Allergies:  Does not bruise/bleed easily.  Psychiatric/Behavioral:  Negative for depression. The patient is not nervous/anxious and does not have insomnia.       Allergies  Allergen Reactions   Doxycycline     Vomiting    Past Medical History:  Diagnosis Date   Elevated BP    Folliculitis    Hyperlipemia    Hypertension    Squamous carcinoma    Tachycardia      Past Surgical History:  Procedure Laterality Date   CORONARY ANGIOPLASTY WITH STENT PLACEMENT     SKIN CANCER DESTRUCTION     SKIN CANCER EXCISION      Social History   Socioeconomic History   Marital status: Married    Spouse name: Not on file   Number of children: Not on file   Years of education: Not on file   Highest education level: Not on file  Occupational History   Not on file  Tobacco Use   Smoking status: Every Day    Packs/day: 0.50    Types: Cigarettes   Smokeless tobacco: Never   Tobacco comments:    pt attempting to d/c; form given 07-30-13  Substance and Sexual Activity   Alcohol use: Yes    Comment: 1 drink daily   Drug use: No   Sexual activity: Yes    Birth control/protection: Surgical  Other Topics Concern   Not on file  Social History Narrative   Not on file   Social Determinants of Health   Financial Resource Strain: Not on file  Food Insecurity: Not on file  Transportation Needs: Not on file  Physical Activity: Not on file  Stress: Not on file  Social Connections: Not on file    Family History  Problem Relation Age of Onset   Lung cancer Mother    Heart disease Father    Prostate  cancer Father    Diabetes Father    Hyperlipidemia Brother    Colon cancer Neg Hx    Throat cancer Neg Hx    Kidney disease Neg Hx    Liver disease Neg Hx     Health Maintenance  Topic Date Due   HIV Screening  Never done   Hepatitis C Screening  Never done   Zoster Vaccines- Shingrix (1 of 2) Never done   COVID-19 Vaccine (3 - Booster for Moderna series) 07/01/2020   TETANUS/TDAP  10/13/2025   COLONOSCOPY (Pts 45-72yrs Insurance coverage will need to be confirmed)  11/22/2027   Pneumococcal Vaccine 95-76 Years old  Completed   INFLUENZA VACCINE  Completed   HPV VACCINES  Aged Out      ----------------------------------------------------------------------------------------------------------------------------------------------------------------------------------------------------------------- Physical Exam BP (!) 147/91 (BP Location: Left Arm, Patient Position: Sitting, Cuff Size: Large)   Pulse 78   Temp (!) 97.4 F (36.3 C)   Ht 5\' 8"  (1.727 m)   Wt 243 lb 8 oz (110.5 kg)   SpO2 94%   BMI 37.02 kg/m   Physical Exam Constitutional:      General: He is not in acute distress. HENT:     Head: Normocephalic and atraumatic.     Right Ear: Tympanic membrane and external ear normal.     Left Ear: Tympanic membrane and external ear normal.  Eyes:     General: No scleral icterus. Neck:     Thyroid: No thyromegaly.  Cardiovascular:     Rate and Rhythm: Normal rate and regular rhythm.     Heart sounds: Normal heart sounds.  Pulmonary:     Effort: Pulmonary effort is normal.     Breath sounds: Normal breath sounds.  Abdominal:     General: Bowel sounds are normal. There is no distension.     Palpations: Abdomen is soft.     Tenderness: There is no abdominal tenderness. There is no guarding.     Hernia: A hernia (Umbilical.  Reducible.) is present.  Musculoskeletal:     Cervical back: Normal range of motion.  Lymphadenopathy:     Cervical: No cervical adenopathy.  Skin:    General: Skin is warm and dry.     Findings: No rash.  Neurological:     Mental Status: He is alert and oriented to person, place, and time.     Cranial Nerves: No cranial nerve deficit.     Motor: No abnormal muscle tone.  Psychiatric:        Mood and Affect: Mood normal.        Behavior: Behavior normal.    ------------------------------------------------------------------------------------------------------------------------------------------------------------------------------------------------------------------- Assessment and Plan  Well adult exam Well adult Orders Placed  This Encounter  Procedures   Pneumococcal conjugate vaccine 20-valent (Prevnar 20)   Flu Vaccine QUAD 6+ mos PF IM (Fluarix Quad PF)   COMPLETE METABOLIC PANEL WITH GFR   CBC with Differential   Lipid Panel w/reflex Direct LDL   PSA  Screenings: PSA Immunizations Prevnar 20 and flu vaccine given today. Anticipatory guidance/risk factor reduction: Recommendations per AVS.  Recommended that he work on quitting smoking.  GERD (gastroesophageal reflux disease) Possible reflux that could be contributing to the tickle sensation in his throat as well as dry heaving.  Adding pantoprazole to see if this helps with these symptoms.   Meds ordered this encounter  Medications   pantoprazole (PROTONIX) 40 MG tablet    Sig: Take 1 tablet (40 mg total) by mouth daily.    Dispense:  30 tablet    Refill:  0    Return in about 6 months (around 12/05/2021) for HTN.    This visit occurred during the SARS-CoV-2 public health emergency.  Safety protocols were in place, including screening questions prior to the visit, additional usage of staff PPE, and extensive cleaning of exam room while observing appropriate contact time as indicated for disinfecting solutions.

## 2021-06-06 NOTE — Patient Instructions (Addendum)
Try pantoprazole daily x2 weeks.  You may continue as needed after this.    Preventive Care 63-63 Years Old, Male Preventive care refers to lifestyle choices and visits with your health care provider that can promote health and wellness. This includes: A yearly physical exam. This is also called an annual wellness visit. Regular dental and eye exams. Immunizations. Screening for certain conditions. Healthy lifestyle choices, such as: Eating a healthy diet. Getting regular exercise. Not using drugs or products that contain nicotine and tobacco. Limiting alcohol use. What can I expect for my preventive care visit? Physical exam Your health care provider will check your: Height and weight. These may be used to calculate your BMI (body mass index). BMI is a measurement that tells if you are at a healthy weight. Heart rate and blood pressure. Body temperature. Skin for abnormal spots. Counseling Your health care provider may ask you questions about your: Past medical problems. Family's medical history. Alcohol, tobacco, and drug use. Emotional well-being. Home life and relationship well-being. Sexual activity. Diet, exercise, and sleep habits. Work and work Astronomer. Access to firearms. What immunizations do I need? Vaccines are usually given at various ages, according to a schedule. Your health care provider will recommend vaccines for you based on your age, medical history, and lifestyle or other factors, such as travel or where you work. What tests do I need? Blood tests Lipid and cholesterol levels. These may be checked every 5 years, or more often if you are over 63 years old. Hepatitis C test. Hepatitis B test. Screening Lung cancer screening. You may have this screening every year starting at age 10 if you have a 30-pack-year history of smoking and currently smoke or have quit within the past 15 years. Prostate cancer screening. Recommendations will vary depending on  your family history and other risks. Genital exam to check for testicular cancer or hernias. Colorectal cancer screening. All adults should have this screening starting at age 57 and continuing until age 72. Your health care provider may recommend screening at age 50 if you are at increased risk. You will have tests every 1-10 years, depending on your results and the type of screening test. Diabetes screening. This is done by checking your blood sugar (glucose) after you have not eaten for a while (fasting). You may have this done every 1-3 years. STD (sexually transmitted disease) testing, if you are at risk. Follow these instructions at home: Eating and drinking  Eat a diet that includes fresh fruits and vegetables, whole grains, lean protein, and low-fat dairy products. Take vitamin and mineral supplements as recommended by your health care provider. Do not drink alcohol if your health care provider tells you not to drink. If you drink alcohol: Limit how much you have to 0-2 drinks a day. Be aware of how much alcohol is in your drink. In the [place] one drink equals one 12 oz bottle of beer (355 mL), one 5 oz glass of wine (148 mL), or one 1 oz glass of hard liquor (44 mL). Lifestyle Take daily care of your teeth and gums. Brush your teeth every morning and night with fluoride toothpaste. Floss one time each day. Stay active. Exercise for at least 30 minutes 5 or more days each week. Do not use any products that contain nicotine or tobacco, such as cigarettes, e-cigarettes, and chewing tobacco. If you need help quitting, ask your health care provider. Do not use drugs. If you are sexually active, practice safe sex. Use a  condom or other form of protection to prevent STIs (sexually transmitted infections). If told by your health care provider, take low-dose aspirin daily starting at age 63. Find healthy ways to cope with stress, such as: Meditation, yoga, or listening to  music. Journaling. Talking to a trusted person. Spending time with friends and family. Safety Always wear your seat belt while driving or riding in a vehicle. Do not drive: If you have been drinking alcohol. Do not ride with someone who has been drinking. When you are tired or distracted. While texting. Wear a helmet and other protective equipment during sports activities. If you have firearms in your house, make sure you follow all gun safety procedures. What's next? Go to your health care provider once a year for an annual wellness visit. Ask your health care provider how often you should have your eyes and teeth checked. Stay up to date on all vaccines. This information is not intended to replace advice given to you by your health care provider. Make sure you discuss any questions you have with your health care provider. Document Revised: 10/13/2020 Document Reviewed: 07/30/2018 Elsevier Patient Education  2022 [employer].

## 2021-06-06 NOTE — Assessment & Plan Note (Signed)
Well adult Orders Placed This Encounter  Procedures  . Pneumococcal conjugate vaccine 20-valent (Prevnar 20)  . Flu Vaccine QUAD 6+ mos PF IM (Fluarix Quad PF)  . COMPLETE METABOLIC PANEL WITH GFR  . CBC with Differential  . Lipid Panel w/reflex Direct LDL  . PSA  Screenings: PSA Immunizations Prevnar 20 and flu vaccine given today. Anticipatory guidance/risk factor reduction: Recommendations per AVS.  Recommended that he work on quitting smoking.

## 2021-06-07 LAB — CBC WITH DIFFERENTIAL/PLATELET
Absolute Monocytes: 550 cells/uL (ref 200–950)
Basophils Absolute: 39 cells/uL (ref 0–200)
Basophils Relative: 0.7 %
Eosinophils Absolute: 83 cells/uL (ref 15–500)
Eosinophils Relative: 1.5 %
HCT: 47.3 % (ref 38.5–50.0)
Hemoglobin: 16.5 g/dL (ref 13.2–17.1)
Lymphs Abs: 1150 cells/uL (ref 850–3900)
MCH: 33.2 pg — ABNORMAL HIGH (ref 27.0–33.0)
MCHC: 34.9 g/dL (ref 32.0–36.0)
MCV: 95.2 fL (ref 80.0–100.0)
MPV: 10.1 fL (ref 7.5–12.5)
Monocytes Relative: 10 %
Neutro Abs: 3680 cells/uL (ref 1500–7800)
Neutrophils Relative %: 66.9 %
Platelets: 111 10*3/uL — ABNORMAL LOW (ref 140–400)
RBC: 4.97 10*6/uL (ref 4.20–5.80)
RDW: 12.2 % (ref 11.0–15.0)
Total Lymphocyte: 20.9 %
WBC: 5.5 10*3/uL (ref 3.8–10.8)

## 2021-06-07 LAB — COMPLETE METABOLIC PANEL WITH GFR
AG Ratio: 1.8 (calc) (ref 1.0–2.5)
ALT: 19 U/L (ref 9–46)
AST: 21 U/L (ref 10–35)
Albumin: 4.3 g/dL (ref 3.6–5.1)
Alkaline phosphatase (APISO): 57 U/L (ref 35–144)
BUN: 12 mg/dL (ref 7–25)
CO2: 27 mmol/L (ref 20–32)
Calcium: 9.4 mg/dL (ref 8.6–10.3)
Chloride: 105 mmol/L (ref 98–110)
Creat: 0.88 mg/dL (ref 0.70–1.35)
Globulin: 2.4 g/dL (calc) (ref 1.9–3.7)
Glucose, Bld: 94 mg/dL (ref 65–99)
Potassium: 4 mmol/L (ref 3.5–5.3)
Sodium: 139 mmol/L (ref 135–146)
Total Bilirubin: 0.9 mg/dL (ref 0.2–1.2)
Total Protein: 6.7 g/dL (ref 6.1–8.1)
eGFR: 97 mL/min/{1.73_m2} (ref >=60)

## 2021-06-07 LAB — LIPID PANEL W/REFLEX DIRECT LDL
Cholesterol: 162 mg/dL (ref <200)
HDL: 65 mg/dL (ref >=40)
LDL Cholesterol (Calc): 76 mg/dL (calc)
Non-HDL Cholesterol (Calc): 97 mg/dL (calc) (ref <130)
Total CHOL/HDL Ratio: 2.5 (calc) (ref <5.0)
Triglycerides: 124 mg/dL (ref <150)

## 2021-06-07 LAB — PSA: PSA: 0.69 ng/mL (ref <=4.00)

## 2021-06-29 ENCOUNTER — Other Ambulatory Visit: Payer: Self-pay | Admitting: Family Medicine

## 2021-08-28 ENCOUNTER — Other Ambulatory Visit: Payer: Self-pay | Admitting: Osteopathic Medicine

## 2021-09-09 ENCOUNTER — Other Ambulatory Visit: Payer: Self-pay | Admitting: Osteopathic Medicine

## 2021-09-26 ENCOUNTER — Other Ambulatory Visit: Payer: Self-pay | Admitting: Family Medicine

## 2021-10-24 ENCOUNTER — Other Ambulatory Visit: Payer: Self-pay | Admitting: Family Medicine

## 2021-12-05 ENCOUNTER — Encounter: Payer: Self-pay | Admitting: Family Medicine

## 2021-12-05 ENCOUNTER — Ambulatory Visit (INDEPENDENT_AMBULATORY_CARE_PROVIDER_SITE_OTHER): Payer: BC Managed Care – PPO | Admitting: Family Medicine

## 2021-12-05 DIAGNOSIS — I10 Essential (primary) hypertension: Secondary | ICD-10-CM

## 2021-12-05 NOTE — Assessment & Plan Note (Signed)
BP is not well controlled today.  Recommend low sodium diet, continuation of medication and checking BP at home.  F/u in 2 weeks for a nurse visit.  If BP continues to remain elevated we'll plan to increase lisinopril.   ?

## 2021-12-05 NOTE — Progress Notes (Signed)
?Craig Mccormick - 66 y.o. male MRN [id number]  Date of birth: 1956/02/14 ? ?Subjective ?Chief Complaint  ?Patient presents with  ? Hypertension  ? ? ?HPI ?Craig Mccormick is a 66 y.o. male here today for follow up of HTN.  Reports blood pressures at home have been well controlled.  Elevated today in clinic.  Denies any symptoms related to this including chest pain, shortness of breath, palpitations, headache or vision changes.  Denies changes to diet.  He is taking medications as directed.  ? ?ROS:  A comprehensive ROS was completed and negative except as noted per HPI ? ?Allergies  ?Allergen Reactions  ? Doxycycline   ?  Vomiting  ? ? ?Past Medical History:  ?Diagnosis Date  ? Elevated BP   ? Folliculitis   ? Hyperlipemia   ? Hypertension   ? Squamous carcinoma   ? Tachycardia   ? ? ?Past Surgical History:  ?Procedure Laterality Date  ? CORONARY ANGIOPLASTY WITH STENT PLACEMENT    ? SKIN CANCER DESTRUCTION    ? SKIN CANCER EXCISION    ? ? ?Social History  ? ?Socioeconomic History  ? Marital status: Married  ?  Spouse name: Not on file  ? Number of children: Not on file  ? Years of education: Not on file  ? Highest education level: Not on file  ?Occupational History  ? Not on file  ?Tobacco Use  ? Smoking status: Every Day  ?  Packs/day: 0.50  ?  Types: Cigarettes  ? Smokeless tobacco: Never  ? Tobacco comments:  ?  pt attempting to d/c; form given 07-30-13  ?Substance and Sexual Activity  ? Alcohol use: Yes  ?  Comment: 1 drink daily  ? Drug use: No  ? Sexual activity: Yes  ?  Birth control/protection: Surgical  ?Other Topics Concern  ? Not on file  ?Social History Narrative  ? Not on file  ? ?Social Determinants of Health  ? ?Financial Resource Strain: Not on file  ?Food Insecurity: Not on file  ?Transportation Needs: Not on file  ?Physical Activity: Not on file  ?Stress: Not on file  ?Social Connections: Not on file  ? ? ?Family History  ?Problem Relation Age of Onset  ? Lung cancer Mother   ? Heart disease Father    ? Prostate cancer Father   ? Diabetes Father   ? Hyperlipidemia Brother   ? Colon cancer Neg Hx   ? Throat cancer Neg Hx   ? Kidney disease Neg Hx   ? Liver disease Neg Hx   ? ? ?Health Maintenance  ?Topic Date Due  ? COVID-19 Vaccine (3 - Booster for Moderna series) 12/21/2021 (Originally 03/26/2020)  ? Zoster Vaccines- Shingrix (2 of 2) 03/06/2022 (Originally 08/05/2021)  ? Hepatitis C Screening  12/06/2022 (Originally 12/30/1975)  ? HIV Screening  12/06/2022 (Originally 12/29/1972)  ? INFLUENZA VACCINE  03/19/2022  ? TETANUS/TDAP  10/13/2025  ? COLONOSCOPY (Pts 45-68yrs Insurance coverage will need to be confirmed)  11/22/2027  ? HPV VACCINES  Aged Out  ? ? ? ?----------------------------------------------------------------------------------------------------------------------------------------------------------------------------------------------------------------- ?Physical Exam ?BP (!) 174/102 (BP Location: Left Arm, Patient Position: Sitting, Cuff Size: Large)   Pulse 79   Ht 5\' 8"  (1.727 m)   Wt 241 lb (109.3 kg)   SpO2 96%   BMI 36.64 kg/m?  ? ?Physical Exam ?Constitutional:   ?   Appearance: Normal appearance.  ?HENT:  ?   Head: Normocephalic and atraumatic.  ?Eyes:  ?   General: No scleral icterus. ?  Cardiovascular:  ?   Rate and Rhythm: Normal rate and regular rhythm.  ?Pulmonary:  ?   Effort: Pulmonary effort is normal.  ?   Breath sounds: Normal breath sounds.  ?Musculoskeletal:  ?   Cervical back: Neck supple.  ?Neurological:  ?   General: No focal deficit present.  ?   Mental Status: He is alert.  ?Psychiatric:     ?   Mood and Affect: Mood normal.     ?   Behavior: Behavior normal.  ? ? ?------------------------------------------------------------------------------------------------------------------------------------------------------------------------------------------------------------------- ?Assessment and Plan ? ?Hypertension ?BP is not well controlled today.  Recommend low sodium diet,  continuation of medication and checking BP at home.  F/u in 2 weeks for a nurse visit.  If BP continues to remain elevated we'll plan to increase lisinopril.   ? ? ?No orders of the defined types were placed in this encounter. ? ? ?Return in about 6 months (around 06/06/2022) for Annual exam/Labs. ? ? ? ?This visit occurred during the SARS-CoV-2 public health emergency.  Safety protocols were in place, including screening questions prior to the visit, additional usage of staff PPE, and extensive cleaning of exam room while observing appropriate contact time as indicated for disinfecting solutions.  ? ?

## 2021-12-19 ENCOUNTER — Ambulatory Visit (INDEPENDENT_AMBULATORY_CARE_PROVIDER_SITE_OTHER): Payer: BC Managed Care – PPO | Admitting: Family Medicine

## 2021-12-19 VITALS — BP 178/97 | HR 68 | Resp 20 | Ht 68.0 in

## 2021-12-19 DIAGNOSIS — I10 Essential (primary) hypertension: Secondary | ICD-10-CM | POA: Diagnosis not present

## 2021-12-19 MED ORDER — LISINOPRIL 20 MG PO TABS: 20.0000 mg | ORAL_TABLET | Freq: Every day | ORAL | 1 refills | Status: DC

## 2021-12-19 NOTE — Progress Notes (Signed)
? ?  Subjective:  ? ? Patient ID: Craig Mccormick, male    DOB: 05/20/1956, 66 y.o.   MRN: [id number] ? ?HPI Patient is here for blood pressure check. Denies trouble sleeping, palpitations or medication problems. ? ? ? ?Review of Systems ? ?   ?Objective:  ? Physical Exam ? ? ? ? ?   ?Assessment & Plan:  ? ?A refill of Lisinopril 20 MG will need to be sent to the pharmacy since [facility] increased the dosage from 10 MG to 20 MG. Patient advised to schedule a follow up with nurse in 2 - 3 weeks for blood pressure check. ? ?

## 2021-12-19 NOTE — Progress Notes (Signed)
atte

## 2021-12-22 ENCOUNTER — Other Ambulatory Visit: Payer: Self-pay | Admitting: Family Medicine

## 2022-01-07 ENCOUNTER — Other Ambulatory Visit: Payer: Self-pay | Admitting: Osteopathic Medicine

## 2022-01-08 ENCOUNTER — Ambulatory Visit (INDEPENDENT_AMBULATORY_CARE_PROVIDER_SITE_OTHER): Payer: BC Managed Care – PPO | Admitting: Family Medicine

## 2022-01-08 VITALS — BP 141/77 | HR 79

## 2022-01-08 DIAGNOSIS — I10 Essential (primary) hypertension: Secondary | ICD-10-CM

## 2022-01-08 NOTE — Progress Notes (Signed)
Medical screening examination/treatment was performed by qualified clinical staff member and as supervising physician I was immediately available for consultation/collaboration. I have reviewed documentation and agree with assessment and plan.  Jodene Polyak, DO  

## 2022-01-08 NOTE — Progress Notes (Signed)
Patient comes in today for blood pressure check.   Craig Mccormick is taking Metoprolol 50 mg one tablet twice daily and Lisinopril 20 mg (he still has 10 mg so has been taking 1 tablet twice daily) for blood pressure control. He denies any missed doses, side effects, headaches, chest pain, palpitations, dizziness, or shortness of breath.   [name] hasn't been checking blood pressure readings at home.  His last reading in the office two weeks ago was 178/97. His first blood pressure reading today is: 146/84. After sitting, his second reading is: 141/77.   I spoke with Dr. Ashley Royalty who advised for patient to continue on current medications and keep appointment in October. Patient to call if he develops any symptoms prior.

## 2022-01-09 ENCOUNTER — Other Ambulatory Visit: Payer: Self-pay | Admitting: Osteopathic Medicine

## 2022-01-20 DIAGNOSIS — R918 Other nonspecific abnormal finding of lung field: Secondary | ICD-10-CM | POA: Diagnosis not present

## 2022-01-20 DIAGNOSIS — R079 Chest pain, unspecified: Secondary | ICD-10-CM | POA: Diagnosis not present

## 2022-01-20 DIAGNOSIS — I10 Essential (primary) hypertension: Secondary | ICD-10-CM | POA: Diagnosis not present

## 2022-01-20 DIAGNOSIS — Z881 Allergy status to other antibiotic agents status: Secondary | ICD-10-CM | POA: Diagnosis not present

## 2022-01-20 DIAGNOSIS — I4891 Unspecified atrial fibrillation: Secondary | ICD-10-CM | POA: Diagnosis not present

## 2022-01-20 DIAGNOSIS — I2583 Coronary atherosclerosis due to lipid rich plaque: Secondary | ICD-10-CM | POA: Diagnosis not present

## 2022-01-20 DIAGNOSIS — R0789 Other chest pain: Secondary | ICD-10-CM | POA: Diagnosis not present

## 2022-01-20 DIAGNOSIS — Z79899 Other long term (current) drug therapy: Secondary | ICD-10-CM | POA: Diagnosis not present

## 2022-01-20 DIAGNOSIS — Z7902 Long term (current) use of antithrombotics/antiplatelets: Secondary | ICD-10-CM | POA: Diagnosis not present

## 2022-01-20 DIAGNOSIS — Z955 Presence of coronary angioplasty implant and graft: Secondary | ICD-10-CM | POA: Diagnosis not present

## 2022-01-20 DIAGNOSIS — I2510 Atherosclerotic heart disease of native coronary artery without angina pectoris: Secondary | ICD-10-CM | POA: Diagnosis not present

## 2022-01-22 DIAGNOSIS — I25119 Atherosclerotic heart disease of native coronary artery with unspecified angina pectoris: Secondary | ICD-10-CM | POA: Diagnosis not present

## 2022-01-22 DIAGNOSIS — I10 Essential (primary) hypertension: Secondary | ICD-10-CM | POA: Diagnosis not present

## 2022-01-31 DIAGNOSIS — I25119 Atherosclerotic heart disease of native coronary artery with unspecified angina pectoris: Secondary | ICD-10-CM | POA: Diagnosis not present

## 2022-01-31 DIAGNOSIS — I10 Essential (primary) hypertension: Secondary | ICD-10-CM | POA: Diagnosis not present

## 2022-02-18 ENCOUNTER — Other Ambulatory Visit: Payer: Self-pay | Admitting: Family Medicine

## 2022-03-06 ENCOUNTER — Other Ambulatory Visit: Payer: Self-pay | Admitting: Family Medicine

## 2022-03-18 DIAGNOSIS — I25119 Atherosclerotic heart disease of native coronary artery with unspecified angina pectoris: Secondary | ICD-10-CM | POA: Diagnosis not present

## 2022-03-18 DIAGNOSIS — I10 Essential (primary) hypertension: Secondary | ICD-10-CM | POA: Diagnosis not present

## 2022-04-18 ENCOUNTER — Other Ambulatory Visit: Payer: Self-pay | Admitting: Family Medicine

## 2022-05-30 ENCOUNTER — Ambulatory Visit (INDEPENDENT_AMBULATORY_CARE_PROVIDER_SITE_OTHER): Payer: BC Managed Care – PPO | Admitting: Family Medicine

## 2022-05-30 ENCOUNTER — Encounter: Payer: Self-pay | Admitting: Family Medicine

## 2022-05-30 VITALS — BP 121/71 | HR 65 | Ht 68.0 in | Wt 242.0 lb

## 2022-05-30 DIAGNOSIS — I2510 Atherosclerotic heart disease of native coronary artery without angina pectoris: Secondary | ICD-10-CM | POA: Diagnosis not present

## 2022-05-30 DIAGNOSIS — Z23 Encounter for immunization: Secondary | ICD-10-CM

## 2022-05-30 DIAGNOSIS — Z0000 Encounter for general adult medical examination without abnormal findings: Secondary | ICD-10-CM | POA: Diagnosis not present

## 2022-05-30 DIAGNOSIS — I10 Essential (primary) hypertension: Secondary | ICD-10-CM | POA: Diagnosis not present

## 2022-05-30 DIAGNOSIS — E782 Mixed hyperlipidemia: Secondary | ICD-10-CM | POA: Diagnosis not present

## 2022-05-30 DIAGNOSIS — Z125 Encounter for screening for malignant neoplasm of prostate: Secondary | ICD-10-CM | POA: Diagnosis not present

## 2022-05-30 NOTE — Progress Notes (Signed)
Craig Mccormick - 66 y.o. male MRN [id number]  Date of birth: [birth year]  Subjective Chief Complaint  Patient presents with   Annual Exam    HPI Craig Mccormick is a 66 y.o. male here today for annual exam.  Reports overall he is doing well.  He does continue to stay pretty active.  Doing pretty well with diet.  He did quit smoking a few months ago.  Does have cravings occasionally.  He has about 1 drink daily.  He has had his flu shot already this year.  Due for second Shingrix.  Review of Systems  Constitutional:  Negative for chills, fever, malaise/fatigue and weight loss.  HENT:  Negative for congestion, ear pain and sore throat.   Eyes:  Negative for blurred vision, double vision and pain.  Respiratory:  Negative for cough and shortness of breath.   Cardiovascular:  Negative for chest pain and palpitations.  Gastrointestinal:  Negative for abdominal pain, blood in stool, constipation, heartburn and nausea.  Genitourinary:  Negative for dysuria and urgency.  Musculoskeletal:  Negative for joint pain and myalgias.  Neurological:  Negative for dizziness and headaches.  Endo/Heme/Allergies:  Does not bruise/bleed easily.  Psychiatric/Behavioral:  Negative for depression. The patient is not nervous/anxious and does not have insomnia.     Allergies  Allergen Reactions   Doxycycline     Vomiting    Past Medical History:  Diagnosis Date   Elevated BP    Folliculitis    Hyperlipemia    Hypertension    Squamous carcinoma    Tachycardia     Past Surgical History:  Procedure Laterality Date   CORONARY ANGIOPLASTY WITH STENT PLACEMENT     SKIN CANCER DESTRUCTION     SKIN CANCER EXCISION      Social History   Socioeconomic History   Marital status: Married    Spouse name: Not on file   Number of children: Not on file   Years of education: Not on file   Highest education level: Not on file  Occupational History   Not on file  Tobacco Use   Smoking status: Every  Day    Packs/day: 0.50    Types: Cigarettes   Smokeless tobacco: Never   Tobacco comments:    pt attempting to d/c; form given 07-30-13  Substance and Sexual Activity   Alcohol use: Yes    Comment: 1 drink daily   Drug use: No   Sexual activity: Yes    Birth control/protection: Surgical  Other Topics Concern   Not on file  Social History Narrative   Not on file   Social Determinants of Health   Financial Resource Strain: Not on file  Food Insecurity: Not on file  Transportation Needs: Not on file  Physical Activity: Not on file  Stress: Not on file  Social Connections: Not on file    Family History  Problem Relation Age of Onset   Lung cancer Mother    Heart disease Father    Prostate cancer Father    Diabetes Father    Hyperlipidemia Brother    Colon cancer Neg Hx    Throat cancer Neg Hx    Kidney disease Neg Hx    Liver disease Neg Hx     Health Maintenance  Topic Date Due   COVID-19 Vaccine (3 - Moderna series) 09/19/2022 (Originally 03/26/2020)   Hepatitis C Screening  12/06/2022 (Originally 12/30/1975)   HIV Screening  12/06/2022 (Originally 12/29/1972)   TETANUS/TDAP  10/13/2025  COLONOSCOPY (Pts 45-47yrs Insurance coverage will need to be confirmed)  11/22/2027   INFLUENZA VACCINE  Completed   Zoster Vaccines- Shingrix  Completed   HPV VACCINES  Aged Out     ----------------------------------------------------------------------------------------------------------------------------------------------------------------------------------------------------------------- Physical Exam BP 121/71 (BP Location: Left Arm, Patient Position: Sitting, Cuff Size: Large)   Pulse 65   Ht 5\' 8"  (1.727 m)   Wt 242 lb (109.8 kg)   SpO2 98%   BMI 36.80 kg/m   Physical Exam Constitutional:      General: He is not in acute distress. HENT:     Head: Normocephalic and atraumatic.     Right Ear: Tympanic membrane and external ear normal.     Left Ear: Tympanic membrane  and external ear normal.  Eyes:     General: No scleral icterus. Neck:     Thyroid: No thyromegaly.  Cardiovascular:     Rate and Rhythm: Normal rate and regular rhythm.     Heart sounds: Normal heart sounds.  Pulmonary:     Effort: Pulmonary effort is normal.     Breath sounds: Normal breath sounds.  Abdominal:     General: Bowel sounds are normal. There is no distension.     Palpations: Abdomen is soft.     Tenderness: There is no abdominal tenderness. There is no guarding.  Musculoskeletal:     Cervical back: Normal range of motion.  Lymphadenopathy:     Cervical: No cervical adenopathy.  Skin:    General: Skin is warm and dry.     Findings: No rash.  Neurological:     Mental Status: He is alert and oriented to person, place, and time.     Cranial Nerves: No cranial nerve deficit.     Motor: No abnormal muscle tone.  Psychiatric:        Mood and Affect: Mood normal.        Behavior: Behavior normal.     ------------------------------------------------------------------------------------------------------------------------------------------------------------------------------------------------------------------- Assessment and Plan  Well adult exam Well adult Orders Placed This Encounter  Procedures   Zoster Recombinant (Shingrix )   COMPLETE METABOLIC PANEL WITH GFR   CBC with Differential   TSH   PSA  Screenings: per lab orders Immunizations:  Shingrix #2. RSV recommended at his pharmacy.  Anticipatory guidance/Risk factor reduction:  Recommendations per AVS.    No orders of the defined types were placed in this encounter.   No follow-ups on file.    This visit occurred during the SARS-CoV-2 public health emergency.  Safety protocols were in place, including screening questions prior to the visit, additional usage of staff PPE, and extensive cleaning of exam room while observing appropriate contact time as indicated for disinfecting solutions.

## 2022-05-30 NOTE — Assessment & Plan Note (Signed)
Well adult Orders Placed This Encounter  Procedures  . Zoster Recombinant (Shingrix )  . COMPLETE METABOLIC PANEL WITH GFR  . CBC with Differential  . TSH  . PSA  Screenings: per lab orders Immunizations:  Shingrix #2. RSV recommended at his pharmacy.  Anticipatory guidance/Risk factor reduction:  Recommendations per AVS.

## 2022-05-30 NOTE — Patient Instructions (Signed)

## 2022-05-31 ENCOUNTER — Encounter: Payer: Self-pay | Admitting: Family Medicine

## 2022-05-31 LAB — CBC WITH DIFFERENTIAL/PLATELET
Absolute Monocytes: 461 cells/uL (ref 200–950)
Basophils Absolute: 28 cells/uL (ref 0–200)
Basophils Relative: 0.6 %
Eosinophils Absolute: 71 cells/uL (ref 15–500)
Eosinophils Relative: 1.5 %
HCT: 37 % — ABNORMAL LOW (ref 38.5–50.0)
Hemoglobin: 12.5 g/dL — ABNORMAL LOW (ref 13.2–17.1)
Lymphs Abs: 1175 cells/uL (ref 850–3900)
MCH: 29.8 pg (ref 27.0–33.0)
MCHC: 33.8 g/dL (ref 32.0–36.0)
MCV: 88.3 fL (ref 80.0–100.0)
MPV: 9.9 fL (ref 7.5–12.5)
Monocytes Relative: 9.8 %
Neutro Abs: 2966 cells/uL (ref 1500–7800)
Neutrophils Relative %: 63.1 %
Platelets: 132 10*3/uL — ABNORMAL LOW (ref 140–400)
RBC: 4.19 10*6/uL — ABNORMAL LOW (ref 4.20–5.80)
RDW: 12.7 % (ref 11.0–15.0)
Total Lymphocyte: 25 %
WBC: 4.7 10*3/uL (ref 3.8–10.8)

## 2022-05-31 LAB — COMPLETE METABOLIC PANEL WITH GFR
AG Ratio: 1.7 (calc) (ref 1.0–2.5)
ALT: 15 U/L (ref 9–46)
AST: 23 U/L (ref 10–35)
Albumin: 4.5 g/dL (ref 3.6–5.1)
Alkaline phosphatase (APISO): 58 U/L (ref 35–144)
BUN: 14 mg/dL (ref 7–25)
CO2: 33 mmol/L — ABNORMAL HIGH (ref 20–32)
Calcium: 9.1 mg/dL (ref 8.6–10.3)
Chloride: 101 mmol/L (ref 98–110)
Creat: 0.89 mg/dL (ref 0.70–1.35)
Globulin: 2.6 g/dL (calc) (ref 1.9–3.7)
Glucose, Bld: 99 mg/dL (ref 65–99)
Potassium: 3.8 mmol/L (ref 3.5–5.3)
Sodium: 140 mmol/L (ref 135–146)
Total Bilirubin: 0.9 mg/dL (ref 0.2–1.2)
Total Protein: 7.1 g/dL (ref 6.1–8.1)
eGFR: 96 mL/min/{1.73_m2} (ref >=60)

## 2022-05-31 LAB — PSA: PSA: 0.71 ng/mL (ref <=4.00)

## 2022-05-31 LAB — TSH: TSH: 1.76 mIU/L (ref 0.40–4.50)

## 2022-06-06 ENCOUNTER — Other Ambulatory Visit: Payer: Self-pay | Admitting: Family Medicine

## 2022-06-06 DIAGNOSIS — D649 Anemia, unspecified: Secondary | ICD-10-CM

## 2022-06-07 ENCOUNTER — Encounter: Payer: BC Managed Care – PPO | Admitting: Family Medicine

## 2022-06-13 ENCOUNTER — Other Ambulatory Visit: Payer: Self-pay | Admitting: Family Medicine

## 2022-06-13 DIAGNOSIS — I10 Essential (primary) hypertension: Secondary | ICD-10-CM

## 2022-06-14 ENCOUNTER — Other Ambulatory Visit (HOSPITAL_COMMUNITY): Payer: Self-pay

## 2022-06-14 ENCOUNTER — Other Ambulatory Visit: Payer: Self-pay | Admitting: Family Medicine

## 2022-06-14 ENCOUNTER — Other Ambulatory Visit: Payer: Self-pay

## 2022-06-14 MED ORDER — HYDROCHLOROTHIAZIDE 12.5 MG PO TABS
12.5000 mg | ORAL_TABLET | Freq: Every day | ORAL | 1 refills | Status: DC
  Filled 2022-06-14: qty 34, 34d supply, fill #0

## 2022-06-14 MED ORDER — HYDROCHLOROTHIAZIDE 12.5 MG PO TABS: 12.5000 mg | ORAL_TABLET | Freq: Every day | ORAL | 1 refills | Status: DC

## 2022-06-15 ENCOUNTER — Other Ambulatory Visit (HOSPITAL_COMMUNITY): Payer: Self-pay

## 2022-06-18 ENCOUNTER — Other Ambulatory Visit: Payer: Self-pay | Admitting: Family Medicine

## 2022-07-05 ENCOUNTER — Other Ambulatory Visit: Payer: Self-pay | Admitting: Family Medicine

## 2022-07-18 DIAGNOSIS — I10 Essential (primary) hypertension: Secondary | ICD-10-CM | POA: Diagnosis not present

## 2022-07-18 DIAGNOSIS — I25119 Atherosclerotic heart disease of native coronary artery with unspecified angina pectoris: Secondary | ICD-10-CM | POA: Diagnosis not present

## 2022-08-14 ENCOUNTER — Other Ambulatory Visit: Payer: Self-pay | Admitting: Family Medicine

## 2022-08-17 DIAGNOSIS — D509 Iron deficiency anemia, unspecified: Secondary | ICD-10-CM | POA: Diagnosis not present

## 2022-08-17 DIAGNOSIS — Z7982 Long term (current) use of aspirin: Secondary | ICD-10-CM | POA: Diagnosis not present

## 2022-08-17 DIAGNOSIS — R6884 Jaw pain: Secondary | ICD-10-CM | POA: Diagnosis not present

## 2022-08-17 DIAGNOSIS — I517 Cardiomegaly: Secondary | ICD-10-CM | POA: Diagnosis not present

## 2022-08-17 DIAGNOSIS — Z85828 Personal history of other malignant neoplasm of skin: Secondary | ICD-10-CM | POA: Diagnosis not present

## 2022-08-17 DIAGNOSIS — Z881 Allergy status to other antibiotic agents status: Secondary | ICD-10-CM | POA: Diagnosis not present

## 2022-08-17 DIAGNOSIS — R0789 Other chest pain: Secondary | ICD-10-CM | POA: Diagnosis not present

## 2022-08-17 DIAGNOSIS — Z7902 Long term (current) use of antithrombotics/antiplatelets: Secondary | ICD-10-CM | POA: Diagnosis not present

## 2022-08-17 DIAGNOSIS — Z87891 Personal history of nicotine dependence: Secondary | ICD-10-CM | POA: Diagnosis not present

## 2022-08-17 DIAGNOSIS — R0602 Shortness of breath: Secondary | ICD-10-CM | POA: Diagnosis not present

## 2022-08-17 DIAGNOSIS — Z955 Presence of coronary angioplasty implant and graft: Secondary | ICD-10-CM | POA: Diagnosis not present

## 2022-08-17 DIAGNOSIS — R0989 Other specified symptoms and signs involving the circulatory and respiratory systems: Secondary | ICD-10-CM | POA: Diagnosis not present

## 2022-08-17 DIAGNOSIS — I10 Essential (primary) hypertension: Secondary | ICD-10-CM | POA: Diagnosis not present

## 2022-08-17 DIAGNOSIS — D500 Iron deficiency anemia secondary to blood loss (chronic): Secondary | ICD-10-CM | POA: Diagnosis not present

## 2022-08-17 DIAGNOSIS — R079 Chest pain, unspecified: Secondary | ICD-10-CM | POA: Diagnosis not present

## 2022-08-21 ENCOUNTER — Ambulatory Visit (INDEPENDENT_AMBULATORY_CARE_PROVIDER_SITE_OTHER): Payer: BC Managed Care – PPO | Admitting: Family Medicine

## 2022-08-21 ENCOUNTER — Encounter: Payer: Self-pay | Admitting: Family Medicine

## 2022-08-21 VITALS — BP 98/60 | HR 65 | Ht 68.0 in | Wt 244.0 lb

## 2022-08-21 DIAGNOSIS — I2510 Atherosclerotic heart disease of native coronary artery without angina pectoris: Secondary | ICD-10-CM | POA: Diagnosis not present

## 2022-08-21 DIAGNOSIS — D649 Anemia, unspecified: Secondary | ICD-10-CM | POA: Insufficient documentation

## 2022-08-21 NOTE — Assessment & Plan Note (Signed)
He did have some chest discomfort in setting of anemia recently.  Ruled out for ACS. He will follow up with his cardiologist.

## 2022-08-21 NOTE — Assessment & Plan Note (Signed)
Worsening anemia over the past few months.  Checking CBC, iron panel, reticulocytes and pathology smear.  He is taking oral iron supplement.  Discussed possibly getting iron infusion if iron levels are significantly low.  May need GI evaluation as well, last colonoscopy 2019.

## 2022-08-21 NOTE — Progress Notes (Signed)
Craig Mccormick - 67 y.o. male MRN [id number]  Date of birth: 05-09-1956  Subjective Chief Complaint  Patient presents with   Hospitalization Follow-up    HPI Craig Mccormick is a 67 y.o. male here today for follow up of recent ED visit.  Notes from ED at [facility] reviewed.  Seen on 08/17/22 with complaint of dyspnea and chest tightness.  Noted to have worsening anemia.  Ruled for ACS with negative enzymes and no EKG changes.  Recommend f/u with PCP and cardiology.  He does see BRB occasionally when wiping.  No dark stool or abdominal pain.  He is on plavix and asa.  No chronic NSAID use.   ROS:  A comprehensive ROS was completed and negative except as noted per HPI  Allergies  Allergen Reactions   Doxycycline     Vomiting    Past Medical History:  Diagnosis Date   Elevated BP    Folliculitis    Hyperlipemia    Hypertension    Squamous carcinoma    Tachycardia     Past Surgical History:  Procedure Laterality Date   CORONARY ANGIOPLASTY WITH STENT PLACEMENT     SKIN CANCER DESTRUCTION     SKIN CANCER EXCISION      Social History   Socioeconomic History   Marital status: Married    Spouse name: Not on file   Number of children: Not on file   Years of education: Not on file   Highest education level: Not on file  Occupational History   Not on file  Tobacco Use   Smoking status: Every Day    Packs/day: 0.50    Types: Cigarettes   Smokeless tobacco: Never   Tobacco comments:    pt attempting to d/c; form given 07-30-13  Substance and Sexual Activity   Alcohol use: Yes    Comment: 1 drink daily   Drug use: No   Sexual activity: Yes    Birth control/protection: Surgical  Other Topics Concern   Not on file  Social History Narrative   Not on file   Social Determinants of Health   Financial Resource Strain: Not on file  Food Insecurity: Not on file  Transportation Needs: Not on file  Physical Activity: Not on file  Stress: Not on file  Social Connections: Not on  file    Family History  Problem Relation Age of Onset   Lung cancer Mother    Heart disease Father    Prostate cancer Father    Diabetes Father    Hyperlipidemia Brother    Colon cancer Neg Hx    Throat cancer Neg Hx    Kidney disease Neg Hx    Liver disease Neg Hx     Health Maintenance  Topic Date Due   COVID-19 Vaccine (3 - 2023-24 season) 09/19/2022 (Originally 04/19/2022)   Hepatitis C Screening  12/06/2022 (Originally 12/30/1975)   HIV Screening  12/06/2022 (Originally 12/29/1972)   DTaP/Tdap/Td (3 - Td or Tdap) 10/13/2025   COLONOSCOPY (Pts 45-22yrs Insurance coverage will need to be confirmed)  11/22/2027   INFLUENZA VACCINE  Completed   Zoster Vaccines- Shingrix  Completed   HPV VACCINES  Aged Out     ----------------------------------------------------------------------------------------------------------------------------------------------------------------------------------------------------------------- Physical Exam BP 98/60 (BP Location: Right Arm, Patient Position: Sitting, Cuff Size: Large)   Pulse 65   Ht 5\' 8"  (1.727 m)   Wt 244 lb (110.7 kg)   SpO2 97%   BMI 37.10 kg/m   Physical Exam Constitutional:  Appearance: Normal appearance.  HENT:     Head: Normocephalic and atraumatic.  Eyes:     General: No scleral icterus. Cardiovascular:     Rate and Rhythm: Normal rate and regular rhythm.  Pulmonary:     Effort: Pulmonary effort is normal.     Breath sounds: Normal breath sounds.  Musculoskeletal:     Cervical back: Neck supple.  Neurological:     General: No focal deficit present.     Mental Status: He is alert.  Psychiatric:        Mood and Affect: Mood normal.        Behavior: Behavior normal.     ------------------------------------------------------------------------------------------------------------------------------------------------------------------------------------------------------------------- Assessment and Plan  CAD  (coronary artery disease) He did have some chest discomfort in setting of anemia recently.  Ruled out for ACS. He will follow up with his cardiologist.   Anemia Worsening anemia over the past few months.  Checking CBC, iron panel, reticulocytes and pathology smear.  He is taking oral iron supplement.  Discussed possibly getting iron infusion if iron levels are significantly low.  May need GI evaluation as well, last colonoscopy 2019.    No orders of the defined types were placed in this encounter.   No follow-ups on file.    This visit occurred during the SARS-CoV-2 public health emergency.  Safety protocols were in place, including screening questions prior to the visit, additional usage of staff PPE, and extensive cleaning of exam room while observing appropriate contact time as indicated for disinfecting solutions.

## 2022-08-22 ENCOUNTER — Other Ambulatory Visit: Payer: Self-pay | Admitting: Family Medicine

## 2022-08-22 DIAGNOSIS — D509 Iron deficiency anemia, unspecified: Secondary | ICD-10-CM

## 2022-08-22 LAB — CBC WITH DIFFERENTIAL/PLATELET
Absolute Monocytes: 428 cells/uL (ref 200–950)
Basophils Absolute: 32 cells/uL (ref 0–200)
Basophils Relative: 0.7 %
Eosinophils Absolute: 81 cells/uL (ref 15–500)
Eosinophils Relative: 1.8 %
HCT: 33.6 % — ABNORMAL LOW (ref 38.5–50.0)
Hemoglobin: 10.1 g/dL — ABNORMAL LOW (ref 13.2–17.1)
Lymphs Abs: 941 cells/uL (ref 850–3900)
MCH: 23.2 pg — ABNORMAL LOW (ref 27.0–33.0)
MCHC: 30.1 g/dL — ABNORMAL LOW (ref 32.0–36.0)
MCV: 77.2 fL — ABNORMAL LOW (ref 80.0–100.0)
MPV: 10.4 fL (ref 7.5–12.5)
Monocytes Relative: 9.5 %
Neutro Abs: 3020 cells/uL (ref 1500–7800)
Neutrophils Relative %: 67.1 %
Platelets: 171 10*3/uL (ref 140–400)
RBC: 4.35 10*6/uL (ref 4.20–5.80)
RDW: 15 % (ref 11.0–15.0)
Total Lymphocyte: 20.9 %
WBC: 4.5 10*3/uL (ref 3.8–10.8)

## 2022-08-22 LAB — IRON,TIBC AND FERRITIN PANEL
%SAT: 9 % (calc) — ABNORMAL LOW (ref 20–48)
Ferritin: 6 ng/mL — ABNORMAL LOW (ref 24–380)
Iron: 37 ug/dL — ABNORMAL LOW (ref 50–180)
TIBC: 429 mcg/dL (calc) — ABNORMAL HIGH (ref 250–425)

## 2022-08-22 LAB — RETICULOCYTES
ABS Retic: 95700 cells/uL — ABNORMAL HIGH (ref 25000–90000)
Retic Ct Pct: 2.2 %

## 2022-08-22 LAB — PATHOLOGIST SMEAR REVIEW

## 2022-08-28 ENCOUNTER — Other Ambulatory Visit: Payer: Self-pay | Admitting: Family

## 2022-08-28 DIAGNOSIS — D649 Anemia, unspecified: Secondary | ICD-10-CM

## 2022-08-30 ENCOUNTER — Encounter: Payer: Self-pay | Admitting: Family

## 2022-08-30 ENCOUNTER — Other Ambulatory Visit: Payer: Self-pay

## 2022-08-30 ENCOUNTER — Inpatient Hospital Stay (HOSPITAL_BASED_OUTPATIENT_CLINIC_OR_DEPARTMENT_OTHER): Payer: BC Managed Care – PPO | Admitting: Family

## 2022-08-30 ENCOUNTER — Inpatient Hospital Stay: Payer: BC Managed Care – PPO | Attending: Hematology & Oncology

## 2022-08-30 VITALS — BP 124/79 | HR 69 | Temp 97.9°F | Resp 18 | Ht 68.0 in | Wt 243.1 lb

## 2022-08-30 DIAGNOSIS — D649 Anemia, unspecified: Secondary | ICD-10-CM

## 2022-08-30 DIAGNOSIS — Z8042 Family history of malignant neoplasm of prostate: Secondary | ICD-10-CM

## 2022-08-30 DIAGNOSIS — Z79899 Other long term (current) drug therapy: Secondary | ICD-10-CM | POA: Diagnosis not present

## 2022-08-30 DIAGNOSIS — F1090 Alcohol use, unspecified, uncomplicated: Secondary | ICD-10-CM | POA: Diagnosis not present

## 2022-08-30 DIAGNOSIS — D509 Iron deficiency anemia, unspecified: Secondary | ICD-10-CM | POA: Diagnosis not present

## 2022-08-30 DIAGNOSIS — Z85828 Personal history of other malignant neoplasm of skin: Secondary | ICD-10-CM | POA: Diagnosis not present

## 2022-08-30 DIAGNOSIS — Z800 Family history of malignant neoplasm of digestive organs: Secondary | ICD-10-CM

## 2022-08-30 DIAGNOSIS — F17210 Nicotine dependence, cigarettes, uncomplicated: Secondary | ICD-10-CM | POA: Insufficient documentation

## 2022-08-30 DIAGNOSIS — Z801 Family history of malignant neoplasm of trachea, bronchus and lung: Secondary | ICD-10-CM | POA: Diagnosis not present

## 2022-08-30 LAB — CBC WITH DIFFERENTIAL (CANCER CENTER ONLY)
Abs Immature Granulocytes: 0.05 10*3/uL (ref 0.00–0.07)
Basophils Absolute: 0 10*3/uL (ref 0.0–0.1)
Basophils Relative: 1 %
Eosinophils Absolute: 0.1 10*3/uL (ref 0.0–0.5)
Eosinophils Relative: 2 %
HCT: 36.2 % — ABNORMAL LOW (ref 39.0–52.0)
Hemoglobin: 10.5 g/dL — ABNORMAL LOW (ref 13.0–17.0)
Immature Granulocytes: 1 %
Lymphocytes Relative: 23 %
Lymphs Abs: 1.1 10*3/uL (ref 0.7–4.0)
MCH: 23 pg — ABNORMAL LOW (ref 26.0–34.0)
MCHC: 29 g/dL — ABNORMAL LOW (ref 30.0–36.0)
MCV: 79.4 fL — ABNORMAL LOW (ref 80.0–100.0)
Monocytes Absolute: 0.5 10*3/uL (ref 0.1–1.0)
Monocytes Relative: 11 %
Neutro Abs: 3.2 10*3/uL (ref 1.7–7.7)
Neutrophils Relative %: 62 %
Platelet Count: 157 10*3/uL (ref 150–400)
RBC: 4.56 MIL/uL (ref 4.22–5.81)
RDW: 17.2 % — ABNORMAL HIGH (ref 11.5–15.5)
WBC Count: 4.9 10*3/uL (ref 4.0–10.5)
nRBC: 0 % (ref 0.0–0.2)

## 2022-08-30 LAB — RETICULOCYTES
Immature Retic Fract: 28 % — ABNORMAL HIGH (ref 2.3–15.9)
RBC.: 4.61 MIL/uL (ref 4.22–5.81)
Retic Count, Absolute: 124.5 10*3/uL (ref 19.0–186.0)
Retic Ct Pct: 2.7 % (ref 0.4–3.1)

## 2022-08-30 LAB — LACTATE DEHYDROGENASE: LDH: 166 U/L (ref 98–192)

## 2022-08-30 NOTE — Progress Notes (Unsigned)
Hematology/Oncology Consultation   Name: Craig Mccormick      MRN: [id number]    Location: Room/bed info not found  Date: 08/30/2022 Time:2:54 PM   REFERRING PHYSICIAN:  Luetta Nutting, DO  REASON FOR CONSULT:  Iron deficiency anemia    DIAGNOSIS: Iron deficiency anemia   HISTORY OF PRESENT ILLNESS: Craig Mccormick is a very pleasant 67 yo caucasian gentleman with iron deficiency anemia.  He states that he was told he had IDA 25 years ago and took an oral iron supplement daily until it resolved.  Most recently he developed fatigue, SOB and then chest pain and went to the ED where he was again diagnosed with IDA.  Iron saturation last week was only 9% and ferritin 6. Hgb is now 10.5, MCV 79, platelets 157 and WBC count 4.9.  He has not noted any obvious blood loss.  No abnormal bruising, no petechiae.  He had a colonoscopy in 2019 and 3 polyps were removed.  He states that he is scheduled for another colonoscopy on 09/12/2022.  He still notes fatigue, SOB with exertion and dizziness.  Stent to the LAD in 2014. He has history of SVT and states that taking metoprolol daily has helped control this. ECHO in June 2023 showed an EF of 55-60%.  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 Familial history of cancer includes: mother - lung and maternal aunt - colon.  No history of diabetes or thyroid disease.  No fever, chills, n/v, cough, rash, chest pain, palpitations, abdominal pain or changes in bowel or bladder habits.  No swelling, tenderness, numbness or tingling in his extremities.  No falls or syncope.  No smoking or recreational drug use.  He quite smoking back in July 2023.  He enjoys a glass of wine each evening. He occasionally has a scotch on weekends.  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   ROS: All other 10 point review of systems is negative.   PAST MEDICAL HISTORY:   Past Medical History:  Diagnosis Date   Elevated BP     Folliculitis    Hyperlipemia    Hypertension    Squamous carcinoma    Tachycardia     ALLERGIES: Allergies  Allergen Reactions   Doxycycline     Vomiting      MEDICATIONS:  Current Outpatient Medications on File Prior to Visit  Medication Sig Dispense Refill   aspirin 81 MG tablet Take 81 mg by mouth daily.     clopidogrel (PLAVIX) 75 MG tablet TAKE 1 TABLET BY MOUTH EVERY DAY 90 tablet 1   Coenzyme Q10 (CO Q-10 PO) Take by mouth.     ferrous sulfate 325 (65 FE) MG tablet Take 325 mg by mouth daily with breakfast.     fish oil-omega-3 fatty acids 1000 MG capsule Take 2 capsules (2 g total) by mouth daily. 180 capsule 3   hydrochlorothiazide (HYDRODIURIL) 12.5 MG tablet Take 1 tablet (12.5 mg total) by mouth daily. 90 tablet 1   lisinopril (ZESTRIL) 20 MG tablet TAKE 1 TABLET BY MOUTH EVERY DAY 90 tablet 1   metoprolol tartrate (LOPRESSOR) 50 MG tablet TAKE 1 TABLET BY MOUTH TWICE A DAY 180 tablet 1   Multiple Vitamins-Minerals (ZINC PO) Take by mouth.     nitroGLYCERIN (NITROSTAT) 0.4 MG SL tablet Place under the tongue.     pantoprazole (PROTONIX) 40 MG tablet TAKE 1 TABLET BY MOUTH EVERY DAY 90 tablet 1  penicillin v potassium (VEETID) 500 MG tablet Take 500 mg by mouth 3 (three) times daily.     rosuvastatin (CRESTOR) 40 MG tablet TAKE 1 TABLET BY MOUTH EVERY DAY 90 tablet 1   tamsulosin (FLOMAX) 0.4 MG CAPS capsule TAKE 2 CAPSULES BY MOUTH EVERY DAY 180 capsule 1   No current facility-administered medications on file prior to visit.     PAST SURGICAL HISTORY Past Surgical History:  Procedure Laterality Date   CORONARY ANGIOPLASTY WITH STENT PLACEMENT     SKIN CANCER DESTRUCTION     SKIN CANCER EXCISION      FAMILY HISTORY: Family History  Problem Relation Age of Onset   Lung cancer Mother    Heart disease Father    Prostate cancer Father    Diabetes Father    Hyperlipidemia Brother    Colon cancer Neg Hx    Throat cancer Neg Hx    Kidney disease Neg Hx     Liver disease Neg Hx     SOCIAL HISTORY:  reports that he has been smoking cigarettes. He has been smoking an average of .5 packs per day. He has never used smokeless tobacco. He reports current alcohol use. He reports that he does not use drugs.  PERFORMANCE STATUS: The patient's performance status is 1 - Symptomatic but completely ambulatory  PHYSICAL EXAM: Most Recent Vital Signs: There were no vitals taken for this visit. BP 124/79 (BP Location: Left Arm, Patient Position: Sitting)   Pulse 69   Temp 97.9 F (36.6 C) (Oral)   Resp 18   Ht 5\' 8"  (1.727 m)   Wt 243 lb 1.3 oz (110.3 kg)   SpO2 98%   BMI 36.96 kg/m   General Appearance:    Alert, cooperative, no distress, appears stated age  Head:    Normocephalic, without obvious abnormality, atraumatic  Eyes:    PERRL, conjunctiva/corneas clear, EOM's intact, fundi    benign, both eyes             Throat:   Lips, mucosa, and tongue normal; teeth and gums normal  Neck:   Supple, symmetrical, trachea midline, no adenopathy;       thyroid:  No enlargement/tenderness/nodules; no carotid   bruit or JVD  Back:     Symmetric, no curvature, ROM normal, no CVA tenderness  Lungs:     Clear to auscultation bilaterally, respirations unlabored  Chest wall:    No tenderness or deformity  Heart:    Regular rate and rhythm, S1 and S2 normal, no murmur, rub   or gallop  Abdomen:     Soft, non-tender, bowel sounds active all four quadrants,    no masses, no organomegaly        Extremities:   Extremities normal, atraumatic, no cyanosis or edema  Pulses:   2+ and symmetric all extremities  Skin:   Skin color, texture, turgor normal, no rashes or lesions  Lymph nodes:   Cervical, supraclavicular, and axillary nodes normal  Neurologic:   CNII-XII intact. Normal strength, sensation and reflexes      throughout    LABORATORY DATA:  No results found for this or any previous visit (from the past 48 hour(s)).    RADIOGRAPHY: No results  found.     PATHOLOGY: None    ASSESSMENT/PLAN: Craig Mccormick is a very pleasant 67 yo caucasian gentleman with iron deficiency anemia.   We will get him set up for 3 doses of IV iron starting next week.  Colonoscopy  09/12/2022.  Follow-up in 8 weeks.   All questions were answered. The patient knows to call the clinic with any problems, questions or concerns. We can certainly see the patient much sooner if necessary.   Eileen Stanford, NP

## 2022-08-31 LAB — ERYTHROPOIETIN: Erythropoietin: 53.9 m[IU]/mL — ABNORMAL HIGH (ref 2.6–18.5)

## 2022-09-02 ENCOUNTER — Encounter: Payer: Self-pay | Admitting: Family

## 2022-09-04 ENCOUNTER — Encounter: Payer: Self-pay | Admitting: Family

## 2022-09-09 ENCOUNTER — Inpatient Hospital Stay: Payer: BC Managed Care – PPO

## 2022-09-09 VITALS — BP 130/77 | HR 64 | Temp 98.0°F | Resp 18

## 2022-09-09 DIAGNOSIS — Z79899 Other long term (current) drug therapy: Secondary | ICD-10-CM | POA: Diagnosis not present

## 2022-09-09 DIAGNOSIS — F17210 Nicotine dependence, cigarettes, uncomplicated: Secondary | ICD-10-CM | POA: Diagnosis not present

## 2022-09-09 DIAGNOSIS — D509 Iron deficiency anemia, unspecified: Secondary | ICD-10-CM

## 2022-09-09 DIAGNOSIS — Z800 Family history of malignant neoplasm of digestive organs: Secondary | ICD-10-CM | POA: Diagnosis not present

## 2022-09-09 DIAGNOSIS — Z85828 Personal history of other malignant neoplasm of skin: Secondary | ICD-10-CM | POA: Diagnosis not present

## 2022-09-09 DIAGNOSIS — Z801 Family history of malignant neoplasm of trachea, bronchus and lung: Secondary | ICD-10-CM | POA: Diagnosis not present

## 2022-09-09 DIAGNOSIS — F1090 Alcohol use, unspecified, uncomplicated: Secondary | ICD-10-CM | POA: Diagnosis not present

## 2022-09-09 DIAGNOSIS — Z8042 Family history of malignant neoplasm of prostate: Secondary | ICD-10-CM | POA: Diagnosis not present

## 2022-09-09 MED ORDER — SODIUM CHLORIDE 0.9 % IV SOLN
300.0000 mg | Freq: Once | INTRAVENOUS | Status: AC
  Administered 2022-09-09: 300 mg via INTRAVENOUS
  Filled 2022-09-09: qty 300

## 2022-09-09 MED ORDER — SODIUM CHLORIDE 0.9 % IV SOLN: Freq: Once | INTRAVENOUS | Status: AC

## 2022-09-11 ENCOUNTER — Encounter: Payer: Self-pay | Admitting: Family Medicine

## 2022-09-11 DIAGNOSIS — I10 Essential (primary) hypertension: Secondary | ICD-10-CM

## 2022-09-11 MED ORDER — LISINOPRIL 20 MG PO TABS: 20.0000 mg | ORAL_TABLET | Freq: Every day | ORAL | 1 refills | Status: DC

## 2022-09-12 DIAGNOSIS — D509 Iron deficiency anemia, unspecified: Secondary | ICD-10-CM | POA: Diagnosis not present

## 2022-09-12 DIAGNOSIS — D61818 Other pancytopenia: Secondary | ICD-10-CM | POA: Diagnosis not present

## 2022-09-12 DIAGNOSIS — Z86010 Personal history of colonic polyps: Secondary | ICD-10-CM | POA: Diagnosis not present

## 2022-09-16 ENCOUNTER — Inpatient Hospital Stay: Payer: BC Managed Care – PPO

## 2022-09-16 VITALS — BP 134/81 | HR 68 | Temp 98.2°F | Resp 19

## 2022-09-16 DIAGNOSIS — Z800 Family history of malignant neoplasm of digestive organs: Secondary | ICD-10-CM | POA: Diagnosis not present

## 2022-09-16 DIAGNOSIS — Z8042 Family history of malignant neoplasm of prostate: Secondary | ICD-10-CM | POA: Diagnosis not present

## 2022-09-16 DIAGNOSIS — Z85828 Personal history of other malignant neoplasm of skin: Secondary | ICD-10-CM | POA: Diagnosis not present

## 2022-09-16 DIAGNOSIS — F17210 Nicotine dependence, cigarettes, uncomplicated: Secondary | ICD-10-CM | POA: Diagnosis not present

## 2022-09-16 DIAGNOSIS — Z79899 Other long term (current) drug therapy: Secondary | ICD-10-CM | POA: Diagnosis not present

## 2022-09-16 DIAGNOSIS — D509 Iron deficiency anemia, unspecified: Secondary | ICD-10-CM

## 2022-09-16 DIAGNOSIS — F1090 Alcohol use, unspecified, uncomplicated: Secondary | ICD-10-CM | POA: Diagnosis not present

## 2022-09-16 DIAGNOSIS — Z801 Family history of malignant neoplasm of trachea, bronchus and lung: Secondary | ICD-10-CM | POA: Diagnosis not present

## 2022-09-16 MED ORDER — SODIUM CHLORIDE 0.9 % IV SOLN
300.0000 mg | Freq: Once | INTRAVENOUS | Status: AC
  Administered 2022-09-16: 300 mg via INTRAVENOUS
  Filled 2022-09-16: qty 300

## 2022-09-16 MED ORDER — SODIUM CHLORIDE 0.9 % IV SOLN: Freq: Once | INTRAVENOUS | Status: AC

## 2022-09-16 MED ORDER — SODIUM CHLORIDE 0.9% FLUSH: 10.0000 mL | Freq: Once | INTRAVENOUS | Status: DC

## 2022-09-16 MED ORDER — HEPARIN SOD (PORK) LOCK FLUSH 100 UNIT/ML IV SOLN: 500.0000 [IU] | Freq: Once | INTRAVENOUS | Status: DC

## 2022-09-16 NOTE — Patient Instructions (Signed)
Iron Sucrose Injection What is this medication? IRON SUCROSE (EYE ern SOO krose) treats low levels of iron (iron deficiency anemia) in people with kidney disease. Iron is a mineral that plays an important role in making red blood cells, which carry oxygen from your lungs to the rest of your body. This medicine may be used for other purposes; ask your health care provider or pharmacist if you have questions. COMMON BRAND NAME(S): Venofer What should I tell my care team before I take this medication? They need to know if you have any of these conditions: Anemia not caused by low iron levels Heart disease High levels of iron in the blood Kidney disease Liver disease An unusual or allergic reaction to iron, other medications, foods, dyes, or preservatives Pregnant or trying to get pregnant Breastfeeding How should I use this medication? This medication is for infusion into a vein. It is given in a hospital or clinic setting. Talk to your care team about the use of this medication in children. While this medication may be prescribed for children as young as 2 years for selected conditions, precautions do apply. Overdosage: If you think you have taken too much of this medicine contact a poison control center or emergency room at once. NOTE: This medicine is only for you. Do not share this medicine with others. What if I miss a dose? Keep appointments for follow-up doses. It is important not to miss your dose. Call your care team if you are unable to keep an appointment. What may interact with this medication? Do not take this medication with any of the following: Deferoxamine Dimercaprol Other iron products This medication may also interact with the following: Chloramphenicol Deferasirox This list may not describe all possible interactions. Give your health care provider a list of all the medicines, herbs, non-prescription drugs, or dietary supplements you use. Also tell them if you smoke,  drink alcohol, or use illegal drugs. Some items may interact with your medicine. What should I watch for while using this medication? Visit your care team regularly. Tell your care team if your symptoms do not start to get better or if they get worse. You may need blood work done while you are taking this medication. You may need to follow a special diet. Talk to your care team. Foods that contain iron include: whole grains/cereals, dried fruits, beans, or peas, leafy green vegetables, and organ meats (liver, kidney). What side effects may I notice from receiving this medication? Side effects that you should report to your care team as soon as possible: Allergic reactions--skin rash, itching, hives, swelling of the face, lips, tongue, or throat Low blood pressure--dizziness, feeling faint or lightheaded, blurry vision Shortness of breath Side effects that usually do not require medical attention (report to your care team if they continue or are bothersome): Flushing Headache Joint pain Muscle pain Nausea Pain, redness, or irritation at injection site This list may not describe all possible side effects. Call your doctor for medical advice about side effects. You may report side effects to FDA at 1-800-FDA-1088. Where should I keep my medication? This medication is given in a hospital or clinic and will not be stored at home. NOTE: This sheet is a summary. It may not cover all possible information. If you have questions about this medicine, talk to your doctor, pharmacist, or health care provider.  2023 Elsevier/Gold Standard (2020-11-16 00:00:00)

## 2022-09-23 ENCOUNTER — Inpatient Hospital Stay: Payer: BC Managed Care – PPO | Attending: Hematology & Oncology

## 2022-09-23 VITALS — BP 129/85 | HR 63 | Temp 97.9°F | Resp 17

## 2022-09-23 DIAGNOSIS — D509 Iron deficiency anemia, unspecified: Secondary | ICD-10-CM | POA: Diagnosis not present

## 2022-09-23 DIAGNOSIS — D61818 Other pancytopenia: Secondary | ICD-10-CM | POA: Diagnosis not present

## 2022-09-23 MED ORDER — SODIUM CHLORIDE 0.9 % IV SOLN: Freq: Once | INTRAVENOUS | Status: AC

## 2022-09-23 MED ORDER — SODIUM CHLORIDE 0.9 % IV SOLN
300.0000 mg | Freq: Once | INTRAVENOUS | Status: AC
  Administered 2022-09-23: 300 mg via INTRAVENOUS
  Filled 2022-09-23: qty 300

## 2022-09-23 NOTE — Patient Instructions (Signed)

## 2022-10-14 ENCOUNTER — Other Ambulatory Visit: Payer: Self-pay | Admitting: Family Medicine

## 2022-10-29 ENCOUNTER — Encounter: Payer: Self-pay | Admitting: Family

## 2022-10-29 ENCOUNTER — Inpatient Hospital Stay: Payer: BC Managed Care – PPO | Attending: Hematology & Oncology

## 2022-10-29 ENCOUNTER — Inpatient Hospital Stay (HOSPITAL_BASED_OUTPATIENT_CLINIC_OR_DEPARTMENT_OTHER): Payer: BC Managed Care – PPO | Admitting: Family

## 2022-10-29 VITALS — BP 132/74 | HR 86 | Temp 98.2°F | Resp 18 | Wt 245.1 lb

## 2022-10-29 DIAGNOSIS — D509 Iron deficiency anemia, unspecified: Secondary | ICD-10-CM | POA: Insufficient documentation

## 2022-10-29 LAB — RETICULOCYTES
Immature Retic Fract: 10 % (ref 2.3–15.9)
RBC.: 5.09 MIL/uL (ref 4.22–5.81)
Retic Count, Absolute: 84.5 10*3/uL (ref 19.0–186.0)
Retic Ct Pct: 1.7 % (ref 0.4–3.1)

## 2022-10-29 LAB — CBC WITH DIFFERENTIAL (CANCER CENTER ONLY)
Abs Immature Granulocytes: 0.04 10*3/uL (ref 0.00–0.07)
Basophils Absolute: 0 10*3/uL (ref 0.0–0.1)
Basophils Relative: 1 %
Eosinophils Absolute: 0.1 10*3/uL (ref 0.0–0.5)
Eosinophils Relative: 1 %
HCT: 43.5 % (ref 39.0–52.0)
Hemoglobin: 14.5 g/dL (ref 13.0–17.0)
Immature Granulocytes: 1 %
Lymphocytes Relative: 22 %
Lymphs Abs: 1.1 10*3/uL (ref 0.7–4.0)
MCH: 28.2 pg (ref 26.0–34.0)
MCHC: 33.3 g/dL (ref 30.0–36.0)
MCV: 84.6 fL (ref 80.0–100.0)
Monocytes Absolute: 0.4 10*3/uL (ref 0.1–1.0)
Monocytes Relative: 9 %
Neutro Abs: 3.3 10*3/uL (ref 1.7–7.7)
Neutrophils Relative %: 66 %
Platelet Count: 117 10*3/uL — ABNORMAL LOW (ref 150–400)
RBC: 5.14 MIL/uL (ref 4.22–5.81)
RDW: 19.1 % — ABNORMAL HIGH (ref 11.5–15.5)
WBC Count: 4.9 10*3/uL (ref 4.0–10.5)
nRBC: 0 % (ref 0.0–0.2)

## 2022-10-29 LAB — FERRITIN: Ferritin: 61 ng/mL (ref 24–336)

## 2022-10-29 NOTE — Progress Notes (Signed)
Hematology and Oncology Follow Up Visit  Craig Mccormick [id number] 1956-03-15 67 y.o. 10/29/2022   Principle Diagnosis:  Iron deficiency anemia   Current Therapy:   IV iron as indicated    Interim History:  Craig Mccormick is here today for follow-up. He is doing well and has no complaints at this time.  He is scheduled for EGD and colonoscopy on 4/30.  He has not noted any obvious blood loss. No bruising or petechiae.  No fever, chills, n/v, cough, rash, dizziness, SOB, chest pain, palpitations, abdominal pain or changes in bowel or bladder habits.  No swelling, tenderness, numbness or tingling in his extremities.  No falls or syncope reported.  Appetite and hydration are good. Weight is stable at 245 lbs.   ECOG Performance Status: 0 - Asymptomatic  Medications:  Allergies as of 10/29/2022       Reactions   Doxycycline Nausea And Vomiting        Medication List        Accurate as of October 29, 2022  1:58 PM. If you have any questions, ask your nurse or doctor.          aspirin 81 MG tablet Take 81 mg by mouth daily.   clopidogrel 75 MG tablet Commonly known as: PLAVIX TAKE 1 TABLET BY MOUTH EVERY DAY   CoQ10 200 MG Caps Take 1 capsule by mouth daily.   ferrous sulfate 325 (65 FE) MG tablet Take 325 mg by mouth daily with breakfast.   fish oil-omega-3 fatty acids 1000 MG capsule Take 2 capsules (2 g total) by mouth daily.   hydrochlorothiazide 12.5 MG tablet Commonly known as: HYDRODIURIL Take 1 tablet (12.5 mg total) by mouth daily.   lisinopril 20 MG tablet Commonly known as: ZESTRIL Take 1 tablet (20 mg total) by mouth daily.   metoprolol tartrate 50 MG tablet Commonly known as: LOPRESSOR TAKE 1 TABLET BY MOUTH TWICE A DAY   nitroGLYCERIN 0.4 MG SL tablet Commonly known as: NITROSTAT Place under the tongue.   pantoprazole 40 MG tablet Commonly known as: PROTONIX TAKE 1 TABLET BY MOUTH EVERY DAY   rosuvastatin 40 MG tablet Commonly known as:  CRESTOR TAKE 1 TABLET BY MOUTH EVERY DAY   tamsulosin 0.4 MG Caps capsule Commonly known as: FLOMAX TAKE 2 CAPSULES BY MOUTH EVERY DAY   ZINC PO Take by mouth.        Allergies:  Allergies  Allergen Reactions   Doxycycline Nausea And Vomiting    Past Medical History, Surgical history, Social history, and Family History were reviewed and updated.  Review of Systems: All other 10 point review of systems is negative.   Physical Exam:  vitals were not taken for this visit.   Wt Readings from Last 3 Encounters:  08/30/22 243 lb 1.3 oz (110.3 kg)  08/21/22 244 lb (110.7 kg)  05/30/22 242 lb (109.8 kg)    Ocular: Sclerae unicteric, pupils equal, round and reactive to light Ear-nose-throat: Oropharynx clear, dentition fair Lymphatic: No cervical or supraclavicular adenopathy Lungs no rales or rhonchi, good excursion bilaterally Heart regular rate and rhythm, no murmur appreciated Abd soft, nontender, positive bowel sounds MSK no focal spinal tenderness, no joint edema Neuro: non-focal, well-oriented, appropriate affect Breasts: Deferred   Lab Results  Component Value Date   WBC 4.9 08/30/2022   HGB 10.5 (L) 08/30/2022   HCT 36.2 (L) 08/30/2022   MCV 79.4 (L) 08/30/2022   PLT 157 08/30/2022   Lab Results  Component Value Date  FERRITIN 6 (L) 08/21/2022   IRON 37 (L) 08/21/2022   TIBC 429 (H) 08/21/2022   IRONPCTSAT 9 (L) 08/21/2022   Lab Results  Component Value Date   RETICCTPCT 2.7 08/30/2022   RBC 4.61 08/30/2022   RETICCTABS 95,700 (H) 08/21/2022   No results found for: "KPAFRELGTCHN", "LAMBDASER", "KAPLAMBRATIO" No results found for: "IGGSERUM", "IGA", "IGMSERUM" No results found for: "TOTALPROTELP", "ALBUMINELP", "A1GS", "A2GS", "BETS", "BETA2SER", "GAMS", "MSPIKE", "SPEI"   Chemistry      Component Value Date/Time   NA 140 05/30/2022 0000   K 3.8 05/30/2022 0000   CL 101 05/30/2022 0000   CO2 33 (H) 05/30/2022 0000   BUN 14 05/30/2022 0000    CREATININE 0.89 05/30/2022 0000      Component Value Date/Time   CALCIUM 9.1 05/30/2022 0000   ALKPHOS 67 06/18/2013 1648   AST 23 05/30/2022 0000   ALT 15 05/30/2022 0000   BILITOT 0.9 05/30/2022 0000       Impression and Plan: Mr. Craig Mccormick is a very pleasant 67 yo caucasian gentleman with iron deficiency anemia. Iron studies are pending. We will replace if needed.  Follow-up in 3 months.       Lottie Dawson, NP 3/12/20241:58 PM

## 2022-10-30 LAB — IRON AND IRON BINDING CAPACITY (CC-WL,HP ONLY)
Iron: 93 ug/dL (ref 45–182)
Saturation Ratios: 26 % (ref 17.9–39.5)
TIBC: 360 ug/dL (ref 250–450)
UIBC: 267 ug/dL (ref 117–376)

## 2022-11-22 ENCOUNTER — Other Ambulatory Visit: Payer: Self-pay | Admitting: Family Medicine

## 2022-12-17 DIAGNOSIS — D509 Iron deficiency anemia, unspecified: Secondary | ICD-10-CM | POA: Diagnosis not present

## 2022-12-17 DIAGNOSIS — Z86010 Personal history of colonic polyps: Secondary | ICD-10-CM | POA: Diagnosis not present

## 2022-12-17 DIAGNOSIS — I2510 Atherosclerotic heart disease of native coronary artery without angina pectoris: Secondary | ICD-10-CM | POA: Diagnosis not present

## 2022-12-17 DIAGNOSIS — K2980 Duodenitis without bleeding: Secondary | ICD-10-CM | POA: Diagnosis not present

## 2022-12-17 LAB — HM COLONOSCOPY

## 2022-12-28 ENCOUNTER — Other Ambulatory Visit: Payer: Self-pay | Admitting: Family Medicine

## 2023-01-16 DIAGNOSIS — I25119 Atherosclerotic heart disease of native coronary artery with unspecified angina pectoris: Secondary | ICD-10-CM | POA: Diagnosis not present

## 2023-01-16 DIAGNOSIS — I10 Essential (primary) hypertension: Secondary | ICD-10-CM | POA: Diagnosis not present

## 2023-01-28 DIAGNOSIS — D509 Iron deficiency anemia, unspecified: Secondary | ICD-10-CM | POA: Diagnosis not present

## 2023-01-29 ENCOUNTER — Inpatient Hospital Stay: Payer: BC Managed Care – PPO | Admitting: Family

## 2023-01-29 ENCOUNTER — Inpatient Hospital Stay: Payer: BC Managed Care – PPO

## 2023-02-11 ENCOUNTER — Encounter: Payer: Self-pay | Admitting: Family Medicine

## 2023-02-24 ENCOUNTER — Other Ambulatory Visit: Payer: Self-pay | Admitting: Family Medicine

## 2023-02-24 DIAGNOSIS — I10 Essential (primary) hypertension: Secondary | ICD-10-CM

## 2023-03-19 ENCOUNTER — Other Ambulatory Visit: Payer: Self-pay | Admitting: Family Medicine

## 2023-03-19 DIAGNOSIS — I10 Essential (primary) hypertension: Secondary | ICD-10-CM

## 2023-03-20 ENCOUNTER — Other Ambulatory Visit: Payer: Self-pay | Admitting: Family Medicine

## 2023-03-20 DIAGNOSIS — I10 Essential (primary) hypertension: Secondary | ICD-10-CM

## 2023-03-20 NOTE — Telephone Encounter (Signed)
Attempted call to patient to inform of need for appt Left voice mail message requesting a return call.

## 2023-03-31 ENCOUNTER — Ambulatory Visit: Payer: BC Managed Care – PPO | Admitting: Family Medicine

## 2023-03-31 VITALS — BP 139/84 | HR 78 | Ht 68.0 in | Wt 246.0 lb

## 2023-03-31 DIAGNOSIS — D509 Iron deficiency anemia, unspecified: Secondary | ICD-10-CM

## 2023-03-31 DIAGNOSIS — I10 Essential (primary) hypertension: Secondary | ICD-10-CM

## 2023-03-31 DIAGNOSIS — E782 Mixed hyperlipidemia: Secondary | ICD-10-CM

## 2023-03-31 MED ORDER — HYDROCHLOROTHIAZIDE 12.5 MG PO TABS: 12.5000 mg | ORAL_TABLET | Freq: Every day | ORAL | 1 refills | Status: AC

## 2023-03-31 MED ORDER — TAMSULOSIN HCL 0.4 MG PO CAPS: 0.8000 mg | ORAL_CAPSULE | Freq: Every day | ORAL | 1 refills | Status: DC

## 2023-03-31 MED ORDER — METOPROLOL TARTRATE 50 MG PO TABS: 50.0000 mg | ORAL_TABLET | Freq: Two times a day (BID) | ORAL | 1 refills | Status: DC

## 2023-03-31 MED ORDER — PANTOPRAZOLE SODIUM 40 MG PO TBEC: 40.0000 mg | DELAYED_RELEASE_TABLET | Freq: Every day | ORAL | 1 refills | Status: DC

## 2023-03-31 MED ORDER — LISINOPRIL 20 MG PO TABS: 20.0000 mg | ORAL_TABLET | Freq: Every day | ORAL | 1 refills | Status: DC

## 2023-03-31 MED ORDER — ROSUVASTATIN CALCIUM 40 MG PO TABS: 40.0000 mg | ORAL_TABLET | Freq: Every day | ORAL | 1 refills | Status: AC

## 2023-03-31 NOTE — Progress Notes (Signed)
Craig Mccormick - 67 y.o. male MRN [id number]  Date of birth: Oct 13, 1955  Subjective Chief Complaint  Patient presents with   Hypertension    HPI Craig Mccormick is a 67 y.o. male here today for follow up.  He reports that he is doing pretty well at this time.  Continues on hydrochlorothiazide, metoprolol and lisinopril for management of hypertension.  Doing well with current medications.  No side effects at this time.  Denies chest pain, shortness of breath, palpitations, headaches or vision changes.  Tolerating Crestor for management of hyperlipidemia.  Energy levels and dyspnea are improved with improvement of his iron deficiency anemia.  He did undergo workup for cause of his anemia including endoscopy, colonoscopy as well as capsule endoscopy without any definitive cause.  No visible bloody or dark stools.  ROS:  A comprehensive ROS was completed and negative except as noted per HPI  Allergies  Allergen Reactions   Doxycycline Nausea And Vomiting    Past Medical History:  Diagnosis Date   Elevated BP    Folliculitis    Hyperlipemia    Hypertension    Squamous carcinoma    Tachycardia     Past Surgical History:  Procedure Laterality Date   CORONARY ANGIOPLASTY WITH STENT PLACEMENT     SKIN CANCER DESTRUCTION     SKIN CANCER EXCISION      Social History   Socioeconomic History   Marital status: Married    Spouse name: Not on file   Number of children: Not on file   Years of education: Not on file   Highest education level: Bachelor's degree (e.g., BA, AB, BS)  Occupational History   Not on file  Tobacco Use   Smoking status: Former    Current packs/day: 0.00    Types: Cigarettes    Quit date: 01/17/2022    Years since quitting: 1.2   Smokeless tobacco: Never  Vaping Use   Vaping status: Never Used  Substance and Sexual Activity   Alcohol use: Yes    Comment: 1 drink daily   Drug use: No   Sexual activity: Yes    Birth control/protection: Surgical  Other  Topics Concern   Not on file  Social History Narrative   Not on file   Social Determinants of Health   Financial Resource Strain: Low Risk  (03/31/2023)   Overall Financial Resource Strain (CARDIA)    Difficulty of Paying Living Expenses: Not hard at all  Food Insecurity: No Food Insecurity (03/31/2023)   Hunger Vital Sign    Worried About Running Out of Food in the Last Year: Never true    Ran Out of Food in the Last Year: Never true  Transportation Needs: No Transportation Needs (03/31/2023)   PRAPARE - Administrator, Civil Service (Medical): No    Lack of Transportation (Non-Medical): No  Physical Activity: Insufficiently Active (03/31/2023)   Exercise Vital Sign    Days of Exercise per Week: 2 days    Minutes of Exercise per Session: 30 min  Stress: No Stress Concern Present (03/31/2023)   [employer] of Occupational Health - Occupational Stress Questionnaire    Feeling of Stress : Only a little  Social Connections: Moderately Isolated (03/31/2023)   Social Connection and Isolation Panel [NHANES]    Frequency of Communication with Friends and Family: Once a week    Frequency of Social Gatherings with Friends and Family: Once a week    Attends Religious Services: 1 to 4 times per  year    Active Member of Clubs or Organizations: No    Attends Engineer, structural: Not on file    Marital Status: Married    Family History  Problem Relation Age of Onset   Lung cancer Mother    Heart disease Father    Prostate cancer Father    Diabetes Father    Hyperlipidemia Brother    Colon cancer Neg Hx    Throat cancer Neg Hx    Kidney disease Neg Hx    Liver disease Neg Hx     Health Maintenance  Topic Date Due   INFLUENZA VACCINE  03/20/2023   COVID-19 Vaccine (3 - Moderna risk series) 05/20/2023 (Originally 02/27/2020)   Hepatitis C Screening  03/30/2024 (Originally 12/30/1975)   HIV Screening  03/30/2024 (Originally 12/29/1972)   DTaP/Tdap/Td (3 - Td or  Tdap) 10/13/2025   Colonoscopy  12/16/2032   Pneumonia Vaccine 29+ Years old  Completed   Zoster Vaccines- Shingrix  Completed   HPV VACCINES  Aged Out     ----------------------------------------------------------------------------------------------------------------------------------------------------------------------------------------------------------------- Physical Exam BP 139/84 (BP Location: Left Arm, Patient Position: Sitting, Cuff Size: Large)   Pulse 78   Ht 5\' 8"  (1.727 m)   Wt 246 lb (111.6 kg)   SpO2 96%   BMI 37.40 kg/m   Physical Exam Constitutional:      Appearance: Normal appearance.  Eyes:     General: No scleral icterus. Cardiovascular:     Rate and Rhythm: Normal rate and regular rhythm.  Pulmonary:     Effort: Pulmonary effort is normal.     Breath sounds: Normal breath sounds.  Musculoskeletal:     Cervical back: Neck supple.  Neurological:     General: No focal deficit present.     Mental Status: He is alert.  Psychiatric:        Mood and Affect: Mood normal.        Behavior: Behavior normal.     ------------------------------------------------------------------------------------------------------------------------------------------------------------------------------------------------------------------- Assessment and Plan  Hypertension Blood pressure remains well-controlled.  He will continue current medications for management of hypertension.  Hyperlipidemia Tolerating rosuvastatin well at current strength.  Continue at current strength.  IDA (iron deficiency anemia) He continues on iron supplement.  Symptoms he was experiencing has improved since improvement of his iron deficiency anemia.  He is no longer seeing hematology.   Meds ordered this encounter  Medications   hydrochlorothiazide (HYDRODIURIL) 12.5 MG tablet    Sig: Take 1 tablet (12.5 mg total) by mouth daily.    Dispense:  90 tablet    Refill:  1   lisinopril (ZESTRIL)  20 MG tablet    Sig: Take 1 tablet (20 mg total) by mouth daily.    Dispense:  90 tablet    Refill:  1   metoprolol tartrate (LOPRESSOR) 50 MG tablet    Sig: Take 1 tablet (50 mg total) by mouth 2 (two) times daily.    Dispense:  180 tablet    Refill:  1   pantoprazole (PROTONIX) 40 MG tablet    Sig: Take 1 tablet (40 mg total) by mouth daily.    Dispense:  90 tablet    Refill:  1   rosuvastatin (CRESTOR) 40 MG tablet    Sig: Take 1 tablet (40 mg total) by mouth daily.    Dispense:  90 tablet    Refill:  1   tamsulosin (FLOMAX) 0.4 MG CAPS capsule    Sig: Take 2 capsules (0.8 mg total) by mouth daily.  Dispense:  180 capsule    Refill:  1    Return in about 6 months (around 10/01/2023) for Annual exam/fasting labs.    This visit occurred during the SARS-CoV-2 public health emergency.  Safety protocols were in place, including screening questions prior to the visit, additional usage of staff PPE, and extensive cleaning of exam room while observing appropriate contact time as indicated for disinfecting solutions.

## 2023-03-31 NOTE — Assessment & Plan Note (Signed)
Blood pressure remains well-controlled.  He will continue current medications for management of hypertension.

## 2023-03-31 NOTE — Assessment & Plan Note (Signed)
He continues on iron supplement.  Symptoms he was experiencing has improved since improvement of his iron deficiency anemia.  He is no longer seeing hematology.

## 2023-03-31 NOTE — Assessment & Plan Note (Signed)
Tolerating rosuvastatin well at current strength.  Continue at current strength.

## 2023-04-18 DIAGNOSIS — I10 Essential (primary) hypertension: Secondary | ICD-10-CM | POA: Diagnosis not present

## 2023-04-18 DIAGNOSIS — I471 Supraventricular tachycardia, unspecified: Secondary | ICD-10-CM | POA: Diagnosis not present

## 2023-04-18 DIAGNOSIS — I25119 Atherosclerotic heart disease of native coronary artery with unspecified angina pectoris: Secondary | ICD-10-CM | POA: Diagnosis not present

## 2023-04-18 DIAGNOSIS — Z1331 Encounter for screening for depression: Secondary | ICD-10-CM | POA: Diagnosis not present

## 2023-05-22 ENCOUNTER — Other Ambulatory Visit: Payer: Self-pay | Admitting: Family Medicine

## 2023-09-23 ENCOUNTER — Other Ambulatory Visit: Payer: Self-pay | Admitting: Family Medicine

## 2023-09-23 DIAGNOSIS — I10 Essential (primary) hypertension: Secondary | ICD-10-CM

## 2023-10-01 ENCOUNTER — Encounter: Payer: Self-pay | Admitting: Family Medicine

## 2023-10-01 ENCOUNTER — Ambulatory Visit (INDEPENDENT_AMBULATORY_CARE_PROVIDER_SITE_OTHER): Payer: BC Managed Care – PPO | Admitting: Family Medicine

## 2023-10-01 VITALS — BP 131/83 | HR 73 | Ht 68.0 in | Wt 256.0 lb

## 2023-10-01 DIAGNOSIS — Z0000 Encounter for general adult medical examination without abnormal findings: Secondary | ICD-10-CM | POA: Diagnosis not present

## 2023-10-01 DIAGNOSIS — D509 Iron deficiency anemia, unspecified: Secondary | ICD-10-CM | POA: Diagnosis not present

## 2023-10-01 DIAGNOSIS — I10 Essential (primary) hypertension: Secondary | ICD-10-CM | POA: Diagnosis not present

## 2023-10-01 DIAGNOSIS — Z125 Encounter for screening for malignant neoplasm of prostate: Secondary | ICD-10-CM | POA: Diagnosis not present

## 2023-10-01 DIAGNOSIS — E782 Mixed hyperlipidemia: Secondary | ICD-10-CM | POA: Diagnosis not present

## 2023-10-01 NOTE — Progress Notes (Signed)
Craig Mccormick - 68 y.o. male MRN [id number]  Date of birth: 06-21-1956  Subjective Chief Complaint  Patient presents with   Annual Exam    HPI Craig Mccormick is a 68 y.o. male here today for annual exam.  He reports that he is doing well.  Marland Kitchen   He is moderately active. He feels that he is doing pretty well with diet.   He is a former smoker.  Occasional EtOH.   Review of Systems  Constitutional:  Negative for chills, fever, malaise/fatigue and weight loss.  HENT:  Negative for congestion, ear pain and sore throat.   Eyes:  Negative for blurred vision, double vision and pain.  Respiratory:  Negative for cough and shortness of breath.   Cardiovascular:  Negative for chest pain and palpitations.  Gastrointestinal:  Negative for abdominal pain, blood in stool, constipation, heartburn and nausea.  Genitourinary:  Negative for dysuria and urgency.  Musculoskeletal:  Negative for joint pain and myalgias.  Neurological:  Negative for dizziness and headaches.  Endo/Heme/Allergies:  Does not bruise/bleed easily.  Psychiatric/Behavioral:  Negative for depression. The patient is not nervous/anxious and does not have insomnia.     Allergies  Allergen Reactions   Doxycycline Nausea And Vomiting    Past Medical History:  Diagnosis Date   Elevated BP    Folliculitis    Hyperlipemia    Hypertension    Squamous carcinoma    Tachycardia     Past Surgical History:  Procedure Laterality Date   CORONARY ANGIOPLASTY WITH STENT PLACEMENT     SKIN CANCER DESTRUCTION     SKIN CANCER EXCISION      Social History   Socioeconomic History   Marital status: Married    Spouse name: Not on file   Number of children: Not on file   Years of education: Not on file   Highest education level: Bachelor's degree (e.g., BA, AB, BS)  Occupational History   Not on file  Tobacco Use   Smoking status: Former    Current packs/day: 0.00    Types: Cigarettes    Quit date: 01/17/2022    Years since  quitting: 1.7   Smokeless tobacco: Never  Vaping Use   Vaping status: Never Used  Substance and Sexual Activity   Alcohol use: Yes    Comment: 1 drink daily   Drug use: No   Sexual activity: Yes    Birth control/protection: Surgical  Other Topics Concern   Not on file  Social History Narrative   Not on file   Social Drivers of Health   Financial Resource Strain: Low Risk  (09/24/2023)   Overall Financial Resource Strain (CARDIA)    Difficulty of Paying Living Expenses: Not hard at all  Food Insecurity: No Food Insecurity (09/24/2023)   Hunger Vital Sign    Worried About Running Out of Food in the Last Year: Never true    Ran Out of Food in the Last Year: Never true  Transportation Needs: No Transportation Needs (09/24/2023)   PRAPARE - Administrator, Civil Service (Medical): No    Lack of Transportation (Non-Medical): No  Physical Activity: Insufficiently Active (09/24/2023)   Exercise Vital Sign    Days of Exercise per Week: 1 day    Minutes of Exercise per Session: 20 min  Stress: No Stress Concern Present (09/24/2023)   [employer] of Occupational Health - Occupational Stress Questionnaire    Feeling of Stress : Not at all  Social Connections: Moderately  Integrated (09/24/2023)   Social Connection and Isolation Panel [NHANES]    Frequency of Communication with Friends and Family: Three times a week    Frequency of Social Gatherings with Friends and Family: Once a week    Attends Religious Services: 1 to 4 times per year    Active Member of [employer] or Organizations: No    Attends Engineer, structural: Not on file    Marital Status: Married    Family History  Problem Relation Age of Onset   Lung cancer Mother    Heart disease Father    Prostate cancer Father    Diabetes Father    Hyperlipidemia Brother    Colon cancer Neg Hx    Throat cancer Neg Hx    Kidney disease Neg Hx    Liver disease Neg Hx     Health Maintenance  Topic Date Due    COVID-19 Vaccine (3 - Moderna risk series) 02/27/2020   Hepatitis C Screening  03/30/2024 (Originally 12/30/1975)   HIV Screening  03/30/2024 (Originally 12/29/1972)   DTaP/Tdap/Td (3 - Td or Tdap) 10/13/2025   Colonoscopy  12/16/2032   Pneumonia Vaccine 42+ Years old  Completed   INFLUENZA VACCINE  Completed   Zoster Vaccines- Shingrix  Completed   HPV VACCINES  Aged Out     ----------------------------------------------------------------------------------------------------------------------------------------------------------------------------------------------------------------- Physical Exam BP 131/83 (BP Location: Left Arm, Patient Position: Sitting, Cuff Size: Large)   Pulse 73   Ht 5\' 8"  (1.727 m)   Wt 256 lb (116.1 kg)   SpO2 96%   BMI 38.92 kg/m   Physical Exam Constitutional:      General: He is not in acute distress. HENT:     Head: Normocephalic and atraumatic.     Right Ear: Tympanic membrane and external ear normal.     Left Ear: Tympanic membrane and external ear normal.  Eyes:     General: No scleral icterus. Neck:     Thyroid: No thyromegaly.  Cardiovascular:     Rate and Rhythm: Normal rate and regular rhythm.     Heart sounds: Normal heart sounds.  Pulmonary:     Effort: Pulmonary effort is normal.     Breath sounds: Normal breath sounds.  Abdominal:     General: Bowel sounds are normal. There is no distension.     Palpations: Abdomen is soft.     Tenderness: There is no abdominal tenderness. There is no guarding.  Musculoskeletal:     Cervical back: Normal range of motion.  Lymphadenopathy:     Cervical: No cervical adenopathy.  Skin:    General: Skin is warm and dry.     Findings: No rash.  Neurological:     Mental Status: He is alert and oriented to person, place, and time.     Cranial Nerves: No cranial nerve deficit.     Motor: No abnormal muscle tone.  Psychiatric:        Mood and Affect: Mood normal.        Behavior: Behavior normal.      ------------------------------------------------------------------------------------------------------------------------------------------------------------------------------------------------------------------- Assessment and Plan  Well adult exam Well adult Orders Placed This Encounter  Procedures   CMP14+EGFR   CBC with Differential/Platelet   PSA   Lipid Panel With LDL/HDL Ratio   Fe+TIBC+Fer  Screenings: per lab orders Immunizations:  UTD Anticipatory guidance/Risk factor reduction:  Recommendations per AVS.    No orders of the defined types were placed in this encounter.   No follow-ups on file.  This visit occurred during the SARS-CoV-2 public health emergency.  Safety protocols were in place, including screening questions prior to the visit, additional usage of staff PPE, and extensive cleaning of exam room while observing appropriate contact time as indicated for disinfecting solutions.

## 2023-10-01 NOTE — Patient Instructions (Signed)
 Preventive Care 73 Years and Older, Male Preventive care refers to lifestyle choices and visits with your health care provider that can promote health and wellness. Preventive care visits are also called wellness exams. What can I expect for my preventive care visit? Counseling During your preventive care visit, your health care provider may ask about your: Medical history, including: Past medical problems. Family medical history. History of falls. Current health, including: Emotional well-being. Home life and relationship well-being. Sexual activity. Memory and ability to understand (cognition). Lifestyle, including: Alcohol, nicotine or tobacco, and drug use. Access to firearms. Diet, exercise, and sleep habits. Work and work Astronomer. Sunscreen use. Safety issues such as seatbelt and bike helmet use. Physical exam Your health care provider will check your: Height and weight. These may be used to calculate your BMI (body mass index). BMI is a measurement that tells if you are at a healthy weight. Waist circumference. This measures the distance around your waistline. This measurement also tells if you are at a healthy weight and may help predict your risk of certain diseases, such as type 2 diabetes and high blood pressure. Heart rate and blood pressure. Body temperature. Skin for abnormal spots. What immunizations do I need?  Vaccines are usually given at various ages, according to a schedule. Your health care provider will recommend vaccines for you based on your age, medical history, and lifestyle or other factors, such as travel or where you work. What tests do I need? Screening Your health care provider may recommend screening tests for certain conditions. This may include: Lipid and cholesterol levels. Diabetes screening. This is done by checking your blood sugar (glucose) after you have not eaten for a while (fasting). Hepatitis C test. Hepatitis B test. HIV (human  immunodeficiency virus) test. STI (sexually transmitted infection) testing, if you are at risk. Lung cancer screening. Colorectal cancer screening. Prostate cancer screening. Abdominal aortic aneurysm (AAA) screening. You may need this if you are a current or former smoker. Talk with your health care provider about your test results, treatment options, and if necessary, the need for more tests. Follow these instructions at home: Eating and drinking  Eat a diet that includes fresh fruits and vegetables, whole grains, lean protein, and low-fat dairy products. Limit your intake of foods with high amounts of sugar, saturated fats, and salt. Take vitamin and mineral supplements as recommended by your health care provider. Do not drink alcohol if your health care provider tells you not to drink. If you drink alcohol: Limit how much you have to 0-2 drinks a day. Know how much alcohol is in your drink. In the [place] one drink equals one 12 oz bottle of beer (355 mL), one 5 oz glass of wine (148 mL), or one 1 oz glass of hard liquor (44 mL). Lifestyle Brush your teeth every morning and night with fluoride toothpaste. Floss one time each day. Exercise for at least 30 minutes 5 or more days each week. Do not use any products that contain nicotine or tobacco. These products include cigarettes, chewing tobacco, and vaping devices, such as e-cigarettes. If you need help quitting, ask your health care provider. Do not use drugs. If you are sexually active, practice safe sex. Use a condom or other form of protection to prevent STIs. Take aspirin only as told by your health care provider. Make sure that you understand how much to take and what form to take. Work with your health care provider to find out whether it is safe  and beneficial for you to take aspirin daily. Ask your health care provider if you need to take a cholesterol-lowering medicine (statin). Find healthy ways to manage stress, such  as: Meditation, yoga, or listening to music. Journaling. Talking to a trusted person. Spending time with friends and family. Safety Always wear your seat belt while driving or riding in a vehicle. Do not drive: If you have been drinking alcohol. Do not ride with someone who has been drinking. When you are tired or distracted. While texting. If you have been using any mind-altering substances or drugs. Wear a helmet and other protective equipment during sports activities. If you have firearms in your house, make sure you follow all gun safety procedures. Minimize exposure to UV radiation to reduce your risk of skin cancer. What's next? Visit your health care provider once a year for an annual wellness visit. Ask your health care provider how often you should have your eyes and teeth checked. Stay up to date on all vaccines. This information is not intended to replace advice given to you by your health care provider. Make sure you discuss any questions you have with your health care provider. Document Revised: 01/31/2021 Document Reviewed: 01/31/2021 Elsevier Patient Education  2024 [employer].

## 2023-10-01 NOTE — Assessment & Plan Note (Signed)
Well adult Orders Placed This Encounter  Procedures   CMP14+EGFR   CBC with Differential/Platelet   PSA   Lipid Panel With LDL/HDL Ratio   Fe+TIBC+Fer  Screenings: per lab orders Immunizations:  UTD Anticipatory guidance/Risk factor reduction:  Recommendations per AVS.

## 2023-10-02 LAB — CMP14+EGFR
ALT: 32 [IU]/L (ref 0–44)
AST: 27 [IU]/L (ref 0–40)
Albumin: 4.4 g/dL (ref 3.9–4.9)
Alkaline Phosphatase: 72 [IU]/L (ref 44–121)
BUN/Creatinine Ratio: 19 (ref 10–24)
BUN: 15 mg/dL (ref 8–27)
Bilirubin Total: 0.8 mg/dL (ref 0.0–1.2)
CO2: 26 mmol/L (ref 20–29)
Calcium: 9.1 mg/dL (ref 8.6–10.2)
Chloride: 101 mmol/L (ref 96–106)
Creatinine, Ser: 0.81 mg/dL (ref 0.76–1.27)
Globulin, Total: 2.1 g/dL (ref 1.5–4.5)
Glucose: 108 mg/dL — ABNORMAL HIGH (ref 70–99)
Potassium: 3.5 mmol/L (ref 3.5–5.2)
Sodium: 141 mmol/L (ref 134–144)
Total Protein: 6.5 g/dL (ref 6.0–8.5)
eGFR: 98 mL/min/{1.73_m2} (ref >59)

## 2023-10-02 LAB — LIPID PANEL WITH LDL/HDL RATIO
Cholesterol, Total: 157 mg/dL (ref 100–199)
HDL: 44 mg/dL (ref >39)
LDL Chol Calc (NIH): 79 mg/dL (ref 0–99)
LDL/HDL Ratio: 1.8 {ratio} (ref 0.0–3.6)
Triglycerides: 206 mg/dL — ABNORMAL HIGH (ref 0–149)
VLDL Cholesterol Cal: 34 mg/dL (ref 5–40)

## 2023-10-02 LAB — CBC WITH DIFFERENTIAL/PLATELET
Basophils Absolute: 0 10*3/uL (ref 0.0–0.2)
Basos: 1 %
EOS (ABSOLUTE): 0.1 10*3/uL (ref 0.0–0.4)
Eos: 2 %
Hematocrit: 44.6 % (ref 37.5–51.0)
Hemoglobin: 15.2 g/dL (ref 13.0–17.7)
Immature Grans (Abs): 0 10*3/uL (ref 0.0–0.1)
Immature Granulocytes: 0 %
Lymphocytes Absolute: 1.2 10*3/uL (ref 0.7–3.1)
Lymphs: 21 %
MCH: 32.1 pg (ref 26.6–33.0)
MCHC: 34.1 g/dL (ref 31.5–35.7)
MCV: 94 fL (ref 79–97)
Monocytes Absolute: 0.6 10*3/uL (ref 0.1–0.9)
Monocytes: 11 %
Neutrophils Absolute: 3.8 10*3/uL (ref 1.4–7.0)
Neutrophils: 65 %
Platelets: 128 10*3/uL — ABNORMAL LOW (ref 150–450)
RBC: 4.74 x10E6/uL (ref 4.14–5.80)
RDW: 12.9 % (ref 11.6–15.4)
WBC: 5.8 10*3/uL (ref 3.4–10.8)

## 2023-10-02 LAB — IRON,TIBC AND FERRITIN PANEL
Ferritin: 138 ng/mL (ref 30–400)
Iron Saturation: 46 % (ref 15–55)
Iron: 132 ug/dL (ref 38–169)
Total Iron Binding Capacity: 288 ug/dL (ref 250–450)
UIBC: 156 ug/dL (ref 111–343)

## 2023-10-02 LAB — PSA: Prostate Specific Ag, Serum: 1.1 ng/mL (ref 0.0–4.0)

## 2023-10-06 ENCOUNTER — Encounter: Payer: Self-pay | Admitting: Family

## 2023-10-07 ENCOUNTER — Encounter: Payer: Self-pay | Admitting: Family Medicine

## 2024-03-18 ENCOUNTER — Ambulatory Visit: Admitting: Family Medicine

## 2024-03-19 ENCOUNTER — Other Ambulatory Visit: Payer: Self-pay | Admitting: Family Medicine

## 2024-04-09 ENCOUNTER — Ambulatory Visit (INDEPENDENT_AMBULATORY_CARE_PROVIDER_SITE_OTHER): Admitting: Family Medicine

## 2024-04-09 ENCOUNTER — Encounter: Payer: Self-pay | Admitting: Family Medicine

## 2024-04-09 VITALS — BP 122/69 | HR 72 | Ht 68.0 in | Wt 260.0 lb

## 2024-04-09 DIAGNOSIS — K429 Umbilical hernia without obstruction or gangrene: Secondary | ICD-10-CM | POA: Insufficient documentation

## 2024-04-09 NOTE — Patient Instructions (Signed)
 Umbilical Hernia, Adult  A hernia is a lump of tissue that pushes through an opening in the muscles. An umbilical hernia happens in the belly, near the belly button. The hernia may contain tissues from the small or large intestine. It may also have fatty tissue that covers the intestines. Umbilical hernias in adults may get worse over time. They need to be treated with surgery. There are several types of umbilical hernias. They include: Indirect hernia. This occurs just above or below the belly button. It's the most common type of umbilical hernia in adults. Direct hernia. This type occurs in an opening that's formed by the belly button. Reducible hernia. This hernia comes and goes. You may see it only when you strain, cough, or lift something heavy. This type of hernia can be pushed back into the belly (reduced). Incarcerated hernia. This traps the hernia in the wall of the belly. This type of hernia can't be pushed back into the belly. It can cause a strangulated hernia. Strangulated hernia. This hernia cuts off blood flow to the tissues inside the hernia. The tissues can die if this happens. This type of hernia must be treated right away. What are the causes? An umbilical hernia happens when tissue inside the belly pushes through an opening in the muscles of the belly. What increases the risk? You're more likely to get this hernia if: You strain while lifting or pushing heavy objects. You've had several pregnancies. You have a condition that puts pressure on your belly, and you've had it for a long time. These include: Obesity. A buildup of fluid inside your belly. Vomiting or coughing all the time. Trouble pooping (constipation). You've had surgery that weakened the muscles in the belly. What are the signs or symptoms? The main symptom of this condition is a bulge at the belly button or near it. The bulge does not cause pain. Other symptoms depend on the type of hernia you have. A  reducible hernia may be seen only when you strain, cough, or lift something heavy. Other symptoms may include: Dull pain. A feeling of pressure. An incarcerated hernia may cause very bad pain. Also, you may: Vomit or feel like you may vomit. Not be able to pass gas. A strangulated hernia may cause: Pain that gets worse and worse. Vomiting, or feeling like you may vomit. Pain when you press on the hernia. Change of color on the skin over the hernia. The skin may become red or purple. Trouble pooping. Blood in the poop. How is this diagnosed? This condition may be diagnosed based on: Your symptoms and medical history. A physical exam. You may be asked to cough or strain while standing. These actions will put pressure inside your belly. The pressure can force the hernia through the opening in your muscles. Your health care provider may try to push the hernia back into your belly (reduce). How is this treated? Surgery is the only treatment for an umbilical hernia. Surgery for a strangulated hernia must be done right away. If you have a small hernia that's not incarcerated, you may need to lose weight before the surgery is done. Follow these instructions at home: Managing constipation You may need to take these actions to prevent trouble pooping. This will help to prevent straining. Drink enough fluid to keep your pee (urine) pale yellow. Take over-the-counter or prescription medicines. Eat foods that are high in fiber, such as beans, whole grains, and fresh fruits and vegetables. Limit foods that are high in  fat and sugars, such as fried or sweet foods. General instructions Do not try to push the hernia back in. Lose weight, if told by your provider. Watch your hernia for any changes in color or size. Tell your provider if any changes occur. You may need to avoid activities that put pressure on your hernia. You may have to avoid lifting. Ask your provider how much you can safely  lift. Take over-the-counter and prescription medicines only as told by your provider. Contact a health care provider if: Your hernia gets larger or feels hard. Your hernia becomes painful. You get a fever or chills. Get help right away if: You get very bad pain near the area of the hernia, and the pain comes on suddenly. You have pain and you vomit or feel like you may vomit. The skin over your hernia changes color. These symptoms may be an emergency. Get help right away. Call 911. Do not wait to see if the symptoms go away. Do not drive yourself to the hospital. This information is not intended to replace advice given to you by your health care provider. Make sure you discuss any questions you have with your health care provider. Document Revised: 11/26/2022 Document Reviewed: 11/26/2022 Elsevier Patient Education  2024 [employer].

## 2024-04-09 NOTE — Progress Notes (Signed)
 Craig Mccormick - 68 y.o. male MRN [id number]  Date of birth: 1956-03-18  Subjective Chief Complaint  Patient presents with   Hernia    HPI Craig Mccormick is a 68 y.o. male here today with complaint of umbilical hernia.  Seems to be causing more pain and he would like to have this repaired.  He does feel bloated after eating. Bowels are moving normally.  No blood in his stool.  Denies fever, chills or nausea.   ROS:  A comprehensive ROS was completed and negative except as noted per HPI  Allergies  Allergen Reactions   Doxycycline  Nausea And Vomiting    Past Medical History:  Diagnosis Date   Elevated BP    Folliculitis    Hyperlipemia    Hypertension    Squamous carcinoma    Tachycardia     Past Surgical History:  Procedure Laterality Date   CORONARY ANGIOPLASTY WITH STENT PLACEMENT     SKIN CANCER DESTRUCTION     SKIN CANCER EXCISION      Social History   Socioeconomic History   Marital status: Married    Spouse name: Not on file   Number of children: Not on file   Years of education: Not on file   Highest education level: Bachelor's degree (e.g., BA, AB, BS)  Occupational History   Not on file  Tobacco Use   Smoking status: Former    Current packs/day: 0.00    Types: Cigarettes    Quit date: 01/17/2022    Years since quitting: 2.2   Smokeless tobacco: Never  Vaping Use   Vaping status: Never Used  Substance and Sexual Activity   Alcohol use: Yes    Comment: 1 drink daily   Drug use: No   Sexual activity: Yes    Birth control/protection: Surgical  Other Topics Concern   Not on file  Social History Narrative   Not on file   Social Drivers of Health   Financial Resource Strain: Low Risk  (03/15/2024)   Overall Financial Resource Strain (CARDIA)    Difficulty of Paying Living Expenses: Not hard at all  Food Insecurity: No Food Insecurity (03/15/2024)   Hunger Vital Sign    Worried About Running Out of Food in the Last Year: Never true    Ran Out of  Food in the Last Year: Never true  Transportation Needs: Unknown (03/15/2024)   PRAPARE - Administrator, Civil Service (Medical): Not on file    Lack of Transportation (Non-Medical): No  Physical Activity: Sufficiently Active (03/15/2024)   Exercise Vital Sign    Days of Exercise per Week: 4 days    Minutes of Exercise per Session: 70 min  Stress: No Stress Concern Present (03/15/2024)   [employer] of Occupational Health - Occupational Stress Questionnaire    Feeling of Stress: Not at all  Social Connections: Unknown (03/15/2024)   Social Connection and Isolation Panel    Frequency of Communication with Friends and Family: Once a week    Frequency of Social Gatherings with Friends and Family: Once a week    Attends Religious Services: 1 to 4 times per year    Active Member of [employer] or Organizations: Not on file    Attends Banker Meetings: Not on file    Marital Status: Married    Family History  Problem Relation Age of Onset   Lung cancer Mother    Heart disease Father    Prostate cancer Father  Diabetes Father    Hyperlipidemia Brother    Colon cancer Neg Hx    Throat cancer Neg Hx    Kidney disease Neg Hx    Liver disease Neg Hx     Health Maintenance  Topic Date Due   Medicare Annual Wellness (AWV)  Never done   Hepatitis C Screening  Never done   COVID-19 Vaccine (3 - Moderna risk series) 02/27/2020   INFLUENZA VACCINE  03/19/2024   DTaP/Tdap/Td (3 - Td or Tdap) 10/13/2025   Colonoscopy  12/16/2032   Pneumococcal Vaccine: 50+ Years  Completed   Zoster Vaccines- Shingrix   Completed   HPV VACCINES  Aged Out   Meningococcal B Vaccine  Aged Out     ----------------------------------------------------------------------------------------------------------------------------------------------------------------------------------------------------------------- Physical Exam BP 122/69 (BP Location: Left Arm, Patient Position: Sitting, Cuff  Size: Large)   Pulse 72   Ht 5' 8 (1.727 m)   Wt 260 lb (117.9 kg)   SpO2 95%   BMI 39.53 kg/m   Physical Exam Constitutional:      Appearance: Normal appearance.  Abdominal:     Hernia: A hernia (umbilical hernia, reducible.) is present.  Neurological:     Mental Status: He is alert.     ------------------------------------------------------------------------------------------------------------------------------------------------------------------------------------------------------------------- Assessment and Plan  Umbilical hernia without obstruction and without gangrene Moderate size umbilical hernia, reducible.  Referral to general surgery to have this repaired.  Red flags reviewed.     No orders of the defined types were placed in this encounter.   No follow-ups on file.

## 2024-04-09 NOTE — Assessment & Plan Note (Signed)
 Moderate size umbilical hernia, reducible.  Referral to general surgery to have this repaired.  Red flags reviewed.

## 2024-05-10 ENCOUNTER — Other Ambulatory Visit: Payer: Self-pay | Admitting: Family Medicine

## 2024-06-21 ENCOUNTER — Encounter: Payer: Self-pay | Admitting: Family Medicine

## 2024-08-01 ENCOUNTER — Other Ambulatory Visit: Payer: Self-pay | Admitting: Family Medicine

## 2024-08-01 ENCOUNTER — Encounter: Payer: Self-pay | Admitting: Family Medicine

## 2024-08-26 ENCOUNTER — Encounter: Payer: Self-pay | Admitting: Family

## 2024-08-26 ENCOUNTER — Ambulatory Visit (INDEPENDENT_AMBULATORY_CARE_PROVIDER_SITE_OTHER): Admitting: Family Medicine

## 2024-08-26 ENCOUNTER — Encounter: Payer: Self-pay | Admitting: Family Medicine

## 2024-08-26 VITALS — BP 121/74 | HR 77 | Ht 68.0 in | Wt 258.0 lb

## 2024-08-26 DIAGNOSIS — E7800 Pure hypercholesterolemia, unspecified: Secondary | ICD-10-CM

## 2024-08-26 DIAGNOSIS — Z125 Encounter for screening for malignant neoplasm of prostate: Secondary | ICD-10-CM | POA: Diagnosis not present

## 2024-08-26 DIAGNOSIS — R739 Hyperglycemia, unspecified: Secondary | ICD-10-CM | POA: Diagnosis not present

## 2024-08-26 DIAGNOSIS — D508 Other iron deficiency anemias: Secondary | ICD-10-CM | POA: Diagnosis not present

## 2024-08-26 DIAGNOSIS — I10 Essential (primary) hypertension: Secondary | ICD-10-CM | POA: Diagnosis not present

## 2024-08-26 NOTE — Assessment & Plan Note (Signed)
 Blood pressure remains well-controlled.  He will continue current medications for management of hypertension.

## 2024-08-26 NOTE — Assessment & Plan Note (Signed)
 Rechecking iron panel.

## 2024-08-26 NOTE — Assessment & Plan Note (Signed)
Tolerating rosuvastatin well at current strength.  Continue at current strength.

## 2024-08-26 NOTE — Progress Notes (Signed)
 " Craig Mccormick - 69 y.o. male MRN [id number]  Date of birth: 23-Apr-1956  Subjective Chief Complaint  Patient presents with   Weight Loss    HPI Craig Mccormick is a 69 y.o. male here today for follow up visit.   He reports that he is doing well.  S/p hernia repair in September. Doing well from this.   He conitnues on lisinopril  and hydrochlorothiazide  as well as metoprolol  for management of HTN.  BP is well controlled.  Denies chest pain, shortness of breath, palpitations, headache or vision change.  Tolerating crestor  well for HLD.  He is exercising regulary, more so than when he was working full time.  He is working on dietary changes.  Despite this he has had difficulty losing weight.    ROS:  A comprehensive ROS was completed and negative except as noted per HPI   Allergies[1]  Past Medical History:  Diagnosis Date   Elevated BP    Folliculitis    Hyperlipemia    Hypertension    Squamous carcinoma    Tachycardia     Past Surgical History:  Procedure Laterality Date   CORONARY ANGIOPLASTY WITH STENT PLACEMENT     SKIN CANCER DESTRUCTION     SKIN CANCER EXCISION      Social History   Socioeconomic History   Marital status: Married    Spouse name: Not on file   Number of children: Not on file   Years of education: Not on file   Highest education level: Bachelor's degree (e.g., BA, AB, BS)  Occupational History   Not on file  Tobacco Use   Smoking status: Former    Current packs/day: 0.00    Average packs/day: 0.5 packs/day    Types: Cigarettes    Quit date: 01/17/2022    Years since quitting: 2.6   Smokeless tobacco: Never  Vaping Use   Vaping status: Never Used  Substance and Sexual Activity   Alcohol use: Yes    Comment: 1 drink daily   Drug use: No   Sexual activity: Yes    Birth control/protection: Surgical  Other Topics Concern   Not on file  Social History Narrative   Not on file   Social Drivers of Health   Tobacco Use: Medium Risk (08/26/2024)    Patient History    Smoking Tobacco Use: Former    Smokeless Tobacco Use: Never    Passive Exposure: Not on Actuary Strain: Low Risk (08/25/2024)   Overall Financial Resource Strain (CARDIA)    Difficulty of Paying Living Expenses: Not hard at all  Food Insecurity: No Food Insecurity (08/25/2024)   Epic    Worried About Radiation Protection Practitioner of Food in the Last Year: Never true    Ran Out of Food in the Last Year: Never true  Transportation Needs: No Transportation Needs (08/25/2024)   Epic    Lack of Transportation (Medical): No    Lack of Transportation (Non-Medical): No  Physical Activity: Sufficiently Active (08/25/2024)   Exercise Vital Sign    Days of Exercise per Week: 5 days    Minutes of Exercise per Session: 70 min  Stress: No Stress Concern Present (08/25/2024)   [employer] of Occupational Health - Occupational Stress Questionnaire    Feeling of Stress: Not at all  Social Connections: Unknown (08/25/2024)   Social Connection and Isolation Panel    Frequency of Communication with Friends and Family: Once a week    Frequency of Social Gatherings with Friends  and Family: Not on file    Attends Religious Services: More than 4 times per year    Active Member of Clubs or Organizations: No    Attends Banker Meetings: Not on file    Marital Status: Married  Depression (PHQ2-9): Low Risk (10/01/2023)   Depression (PHQ2-9)    PHQ-2 Score: 0  Alcohol Screen: Low Risk (08/25/2024)   Alcohol Screen    Last Alcohol Screening Score (AUDIT): 3  Housing: Low Risk (08/25/2024)   Epic    Unable to Pay for Housing in the Last Year: No    Number of Times Moved in the Last Year: 0    Homeless in the Last Year: No  Utilities: Not At Risk (04/12/2024)   Received from [facility]    In the past 12 months has the electric, gas, oil, or water company threatened to shut off services in your home?: No  Health Literacy: Adequate Health Literacy (04/09/2024)   B1300  Health Literacy    Frequency of need for help with medical instructions: Never    Family History  Problem Relation Age of Onset   Lung cancer Mother    Heart disease Father    Prostate cancer Father    Diabetes Father    Hyperlipidemia Brother    Colon cancer Neg Hx    Throat cancer Neg Hx    Kidney disease Neg Hx    Liver disease Neg Hx     Health Maintenance  Topic Date Due   Medicare Annual Wellness (AWV)  Never done   Hepatitis C Screening  Never done   COVID-19 Vaccine (3 - Moderna risk series) 09/11/2024 (Originally 02/27/2020)   DTaP/Tdap/Td (3 - Td or Tdap) 10/13/2025   Colonoscopy  12/16/2032   Pneumococcal Vaccine: 50+ Years  Completed   Influenza Vaccine  Completed   Zoster Vaccines- Shingrix   Completed   Meningococcal B Vaccine  Aged Out     ----------------------------------------------------------------------------------------------------------------------------------------------------------------------------------------------------------------- Physical Exam BP 121/74 (BP Location: Left Arm, Patient Position: Sitting, Cuff Size: Large)   Pulse 77   Ht 5' 8 (1.727 m)   Wt 258 lb (117 kg)   SpO2 96%   BMI 39.23 kg/m   Physical Exam Constitutional:      Appearance: Normal appearance.  Eyes:     General: No scleral icterus. Cardiovascular:     Rate and Rhythm: Normal rate and regular rhythm.  Pulmonary:     Effort: Pulmonary effort is normal.     Breath sounds: Normal breath sounds.  Musculoskeletal:     Cervical back: Neck supple.  Neurological:     Mental Status: He is alert.  Psychiatric:        Mood and Affect: Mood normal.        Behavior: Behavior normal.     ------------------------------------------------------------------------------------------------------------------------------------------------------------------------------------------------------------------- Assessment and Plan  Hypertension Blood pressure remains  well-controlled.  He will continue current medications for management of hypertension.  Hyperlipidemia Tolerating rosuvastatin  well at current strength.  Continue at current strength.  IDA (iron  deficiency anemia) Rechecking iron  panel.    No orders of the defined types were placed in this encounter.   Return in about 6 months (around 02/23/2025) for Hypertension.         [1]  Allergies Allergen Reactions   Doxycycline  Nausea And Vomiting   "

## 2024-08-31 LAB — CBC WITH DIFFERENTIAL/PLATELET
Basophils Absolute: 0 x10E3/uL (ref 0.0–0.2)
Basos: 1 %
EOS (ABSOLUTE): 0.1 x10E3/uL (ref 0.0–0.4)
Eos: 2 %
Hematocrit: 47.2 % (ref 37.5–51.0)
Hemoglobin: 15.7 g/dL (ref 13.0–17.7)
Immature Grans (Abs): 0 x10E3/uL (ref 0.0–0.1)
Immature Granulocytes: 0 %
Lymphocytes Absolute: 1.3 x10E3/uL (ref 0.7–3.1)
Lymphs: 25 %
MCH: 32.4 pg (ref 26.6–33.0)
MCHC: 33.3 g/dL (ref 31.5–35.7)
MCV: 98 fL — ABNORMAL HIGH (ref 79–97)
Monocytes Absolute: 0.6 x10E3/uL (ref 0.1–0.9)
Monocytes: 11 %
Neutrophils Absolute: 3.2 x10E3/uL (ref 1.4–7.0)
Neutrophils: 61 %
Platelets: 113 x10E3/uL — ABNORMAL LOW (ref 150–450)
RBC: 4.84 x10E6/uL (ref 4.14–5.80)
RDW: 12.7 % (ref 11.6–15.4)
WBC: 5.2 x10E3/uL (ref 3.4–10.8)

## 2024-08-31 LAB — LIPID PANEL WITH LDL/HDL RATIO
Cholesterol, Total: 181 mg/dL (ref 100–199)
HDL: 57 mg/dL
LDL Chol Calc (NIH): 100 mg/dL — ABNORMAL HIGH (ref 0–99)
LDL/HDL Ratio: 1.8 ratio (ref 0.0–3.6)
Triglycerides: 136 mg/dL (ref 0–149)
VLDL Cholesterol Cal: 24 mg/dL (ref 5–40)

## 2024-08-31 LAB — CMP14+EGFR
ALT: 36 IU/L (ref 0–44)
AST: 42 IU/L — ABNORMAL HIGH (ref 0–40)
Albumin: 4.6 g/dL (ref 3.9–4.9)
Alkaline Phosphatase: 75 IU/L (ref 47–123)
BUN/Creatinine Ratio: 15 (ref 10–24)
BUN: 14 mg/dL (ref 8–27)
Bilirubin Total: 0.8 mg/dL (ref 0.0–1.2)
CO2: 25 mmol/L (ref 20–29)
Calcium: 9.2 mg/dL (ref 8.6–10.2)
Chloride: 104 mmol/L (ref 96–106)
Creatinine, Ser: 0.93 mg/dL (ref 0.76–1.27)
Globulin, Total: 1.9 g/dL (ref 1.5–4.5)
Glucose: 106 mg/dL — ABNORMAL HIGH (ref 70–99)
Potassium: 4 mmol/L (ref 3.5–5.2)
Sodium: 143 mmol/L (ref 134–144)
Total Protein: 6.5 g/dL (ref 6.0–8.5)
eGFR: 91 mL/min/1.73

## 2024-08-31 LAB — PSA: Prostate Specific Ag, Serum: 1.2 ng/mL (ref 0.0–4.0)

## 2024-08-31 LAB — IRON,TIBC AND FERRITIN PANEL
Ferritin: 235 ng/mL (ref 30–400)
Iron Saturation: 42 % (ref 15–55)
Iron: 122 ug/dL (ref 38–169)
Total Iron Binding Capacity: 289 ug/dL (ref 250–450)
UIBC: 167 ug/dL (ref 111–343)

## 2024-08-31 LAB — TSH: TSH: 2.4 u[IU]/mL (ref 0.450–4.500)

## 2024-08-31 LAB — HEMOGLOBIN A1C
Est. average glucose Bld gHb Est-mCnc: 120 mg/dL
Hgb A1c MFr Bld: 5.8 % — ABNORMAL HIGH (ref 4.8–5.6)

## 2024-09-05 ENCOUNTER — Ambulatory Visit: Payer: Self-pay | Admitting: Family Medicine

## 2025-02-24 ENCOUNTER — Ambulatory Visit: Admitting: Family Medicine
